# Patient Record
Sex: Male | Born: 1935 | Race: Black or African American | Hispanic: No | Marital: Married | Smoking: Never smoker
Health system: Southern US, Community
[De-identification: ages and names within clinical notes are randomized; demographics above are authoritative.]

## PROBLEM LIST (undated history)

## (undated) DIAGNOSIS — I10 Essential (primary) hypertension: Secondary | ICD-10-CM

---

## 2011-11-21 ENCOUNTER — Ambulatory Visit: Payer: Self-pay | Admitting: Surgery

## 2012-05-05 ENCOUNTER — Observation Stay: Payer: Self-pay | Admitting: Internal Medicine

## 2012-05-05 LAB — URINALYSIS, COMPLETE
Bilirubin,UR: NEGATIVE
Blood: NEGATIVE
Ketone: NEGATIVE
Leukocyte Esterase: NEGATIVE
Nitrite: NEGATIVE
Protein: NEGATIVE
RBC,UR: 1 /HPF (ref 0–5)
Squamous Epithelial: NONE SEEN
WBC UR: <1 /HPF (ref 0–5)

## 2012-05-05 LAB — CBC
HCT: 43.3 % (ref 40.0–52.0)
MCHC: 32.7 g/dL (ref 32.0–36.0)
MCV: 90 fL (ref 80–100)
Platelet: 184 10*3/uL (ref 150–440)
RBC: 4.8 10*6/uL (ref 4.40–5.90)
RDW: 13.6 % (ref 11.5–14.5)

## 2012-05-05 LAB — COMPREHENSIVE METABOLIC PANEL
Anion Gap: 8 (ref 7–16)
BUN: 16 mg/dL (ref 7–18)
Bilirubin,Total: 0.5 mg/dL (ref 0.2–1.0)
Calcium, Total: 8.9 mg/dL (ref 8.5–10.1)
Chloride: 108 mmol/L — ABNORMAL HIGH (ref 98–107)
Creatinine: 1.17 mg/dL (ref 0.60–1.30)
Osmolality: 285 (ref 275–301)
SGPT (ALT): 40 U/L
Total Protein: 7.7 g/dL (ref 6.4–8.2)

## 2012-05-05 LAB — CK TOTAL AND CKMB (NOT AT ARMC): CK-MB: 3.2 ng/mL (ref 0.5–3.6)

## 2012-05-05 LAB — TROPONIN I: Troponin-I: 0.03 ng/mL

## 2012-05-05 LAB — PROTIME-INR: Prothrombin Time: 22.8 secs — ABNORMAL HIGH (ref 11.5–14.7)

## 2012-05-06 LAB — CBC WITH DIFFERENTIAL/PLATELET
Basophil #: 0 10*3/uL (ref 0.0–0.1)
Basophil %: 0.4 %
HCT: 39.4 % — ABNORMAL LOW (ref 40.0–52.0)
MCH: 30 pg (ref 26.0–34.0)
MCHC: 33.6 g/dL (ref 32.0–36.0)
MCV: 89 fL (ref 80–100)
Monocyte #: 0.4 x10 3/mm (ref 0.2–1.0)
Neutrophil #: 2.4 10*3/uL (ref 1.4–6.5)
RDW: 13.2 % (ref 11.5–14.5)
WBC: 4.8 10*3/uL (ref 3.8–10.6)

## 2012-05-06 LAB — BASIC METABOLIC PANEL
Anion Gap: 11 (ref 7–16)
Creatinine: 1.07 mg/dL (ref 0.60–1.30)
EGFR (Non-African Amer.): >60
Osmolality: 292 (ref 275–301)

## 2012-05-06 LAB — LIPID PANEL
Cholesterol: 128 mg/dL (ref 0–200)
HDL Cholesterol: 43 mg/dL (ref 40–60)
Ldl Cholesterol, Calc: 70 mg/dL (ref 0–100)
Triglycerides: 76 mg/dL (ref 0–200)
VLDL Cholesterol, Calc: 15 mg/dL (ref 5–40)

## 2013-01-31 LAB — CBC WITH DIFFERENTIAL/PLATELET
Basophil %: 0.4 %
Eosinophil #: 0.1 10*3/uL (ref 0.0–0.7)
Eosinophil %: 1.2 %
Lymphocyte #: 1.1 10*3/uL (ref 1.0–3.6)
MCH: 30.5 pg (ref 26.0–34.0)
MCV: 91 fL (ref 80–100)
Monocyte #: 0.4 x10 3/mm (ref 0.2–1.0)
Platelet: 203 10*3/uL (ref 150–440)
RBC: 4.62 10*6/uL (ref 4.40–5.90)
RDW: 13.8 % (ref 11.5–14.5)
WBC: 5.1 10*3/uL (ref 3.8–10.6)

## 2013-01-31 LAB — COMPREHENSIVE METABOLIC PANEL
Albumin: 3.4 g/dL (ref 3.4–5.0)
Alkaline Phosphatase: 84 U/L (ref 50–136)
Anion Gap: 9 (ref 7–16)
BUN: 13 mg/dL (ref 7–18)
Chloride: 107 mmol/L (ref 98–107)
Co2: 25 mmol/L (ref 21–32)
EGFR (African American): >60
Glucose: 116 mg/dL — ABNORMAL HIGH (ref 65–99)
Osmolality: 282 (ref 275–301)
SGOT(AST): 41 U/L — ABNORMAL HIGH (ref 15–37)
SGPT (ALT): 27 U/L (ref 12–78)
Sodium: 141 mmol/L (ref 136–145)
Total Protein: 7.4 g/dL (ref 6.4–8.2)

## 2013-01-31 LAB — URINALYSIS, COMPLETE
Glucose,UR: NEGATIVE mg/dL (ref 0–75)
Ketone: NEGATIVE
Leukocyte Esterase: NEGATIVE
Nitrite: NEGATIVE
Protein: 30
Specific Gravity: 1.013 (ref 1.003–1.030)
Squamous Epithelial: NONE SEEN

## 2013-01-31 LAB — TROPONIN I: Troponin-I: <0.02 ng/mL

## 2013-01-31 LAB — PRO B NATRIURETIC PEPTIDE: B-Type Natriuretic Peptide: 106 pg/mL (ref 0–450)

## 2013-01-31 LAB — PROTIME-INR: INR: 2.1

## 2013-02-01 ENCOUNTER — Observation Stay: Payer: Self-pay | Admitting: Family Medicine

## 2013-02-01 LAB — CK-MB: CK-MB: 5.1 ng/mL — ABNORMAL HIGH (ref 0.5–3.6)

## 2013-02-01 LAB — TROPONIN I: Troponin-I: 0.04 ng/mL

## 2013-12-26 ENCOUNTER — Emergency Department: Payer: Self-pay | Admitting: Emergency Medicine

## 2013-12-26 LAB — CBC
HCT: 49.1 % (ref 40.0–52.0)
HGB: 16.1 g/dL (ref 13.0–18.0)
MCH: 29.5 pg (ref 26.0–34.0)
MCHC: 32.8 g/dL (ref 32.0–36.0)
MCV: 90 fL (ref 80–100)
Platelet: 210 10*3/uL (ref 150–440)
RBC: 5.45 10*6/uL (ref 4.40–5.90)
RDW: 13.9 % (ref 11.5–14.5)
WBC: 3.3 10*3/uL — ABNORMAL LOW (ref 3.8–10.6)

## 2013-12-26 LAB — BASIC METABOLIC PANEL
Anion Gap: 3 — ABNORMAL LOW (ref 7–16)
BUN: 9 mg/dL (ref 7–18)
CREATININE: 1.13 mg/dL (ref 0.60–1.30)
Calcium, Total: 9.2 mg/dL (ref 8.5–10.1)
Chloride: 106 mmol/L (ref 98–107)
Co2: 30 mmol/L (ref 21–32)
EGFR (African American): >60
GFR CALC NON AF AMER: 58 — AB
Glucose: 86 mg/dL (ref 65–99)
OSMOLALITY: 276 (ref 275–301)
Potassium: 4.2 mmol/L (ref 3.5–5.1)
Sodium: 139 mmol/L (ref 136–145)

## 2013-12-26 LAB — TROPONIN I: Troponin-I: <0.02 ng/mL

## 2013-12-26 LAB — PROTIME-INR
INR: 2.8
Prothrombin Time: 28.9 secs — ABNORMAL HIGH (ref 11.5–14.7)

## 2013-12-31 LAB — COMPREHENSIVE METABOLIC PANEL
ALK PHOS: 80 U/L
ANION GAP: 4 — AB (ref 7–16)
AST: 27 U/L (ref 15–37)
Albumin: 3.5 g/dL (ref 3.4–5.0)
BILIRUBIN TOTAL: 0.5 mg/dL (ref 0.2–1.0)
BUN: 11 mg/dL (ref 7–18)
CALCIUM: 9 mg/dL (ref 8.5–10.1)
Chloride: 107 mmol/L (ref 98–107)
Co2: 26 mmol/L (ref 21–32)
Creatinine: 1.06 mg/dL (ref 0.60–1.30)
EGFR (African American): >60
Glucose: 111 mg/dL — ABNORMAL HIGH (ref 65–99)
OSMOLALITY: 274 (ref 275–301)
POTASSIUM: 3.6 mmol/L (ref 3.5–5.1)
SGPT (ALT): 17 U/L (ref 12–78)
Sodium: 137 mmol/L (ref 136–145)
TOTAL PROTEIN: 7.4 g/dL (ref 6.4–8.2)

## 2013-12-31 LAB — CBC
HCT: 41.6 % (ref 40.0–52.0)
HGB: 14.2 g/dL (ref 13.0–18.0)
MCH: 30.4 pg (ref 26.0–34.0)
MCHC: 34.2 g/dL (ref 32.0–36.0)
MCV: 89 fL (ref 80–100)
PLATELETS: 175 10*3/uL (ref 150–440)
RBC: 4.68 10*6/uL (ref 4.40–5.90)
RDW: 13.6 % (ref 11.5–14.5)
WBC: 3.8 10*3/uL (ref 3.8–10.6)

## 2013-12-31 LAB — APTT: Activated PTT: 47.5 secs — ABNORMAL HIGH (ref 23.6–35.9)

## 2013-12-31 LAB — PROTIME-INR
INR: 3.4
Prothrombin Time: 33.3 secs — ABNORMAL HIGH (ref 11.5–14.7)

## 2013-12-31 LAB — CK TOTAL AND CKMB (NOT AT ARMC)
CK, TOTAL: 268 U/L — AB (ref 35–232)
CK-MB: 10.6 ng/mL — ABNORMAL HIGH (ref 0.5–3.6)

## 2013-12-31 LAB — TROPONIN I: Troponin-I: <0.02 ng/mL

## 2014-01-01 ENCOUNTER — Observation Stay: Payer: Self-pay | Admitting: Internal Medicine

## 2014-01-01 LAB — BASIC METABOLIC PANEL
Anion Gap: 5 — ABNORMAL LOW (ref 7–16)
BUN: 8 mg/dL (ref 7–18)
CALCIUM: 9.4 mg/dL (ref 8.5–10.1)
CO2: 27 mmol/L (ref 21–32)
CREATININE: 1.1 mg/dL (ref 0.60–1.30)
Chloride: 106 mmol/L (ref 98–107)
EGFR (Non-African Amer.): >60
Glucose: 95 mg/dL (ref 65–99)
Osmolality: 274 (ref 275–301)
POTASSIUM: 3.9 mmol/L (ref 3.5–5.1)
SODIUM: 138 mmol/L (ref 136–145)

## 2014-01-01 LAB — CBC WITH DIFFERENTIAL/PLATELET
BASOS PCT: 0.6 %
Basophil #: 0 10*3/uL (ref 0.0–0.1)
EOS ABS: 0.1 10*3/uL (ref 0.0–0.7)
Eosinophil %: 1.3 %
HCT: 43.4 % (ref 40.0–52.0)
HGB: 14.6 g/dL (ref 13.0–18.0)
Lymphocyte #: 1.4 10*3/uL (ref 1.0–3.6)
Lymphocyte %: 27.8 %
MCH: 30.1 pg (ref 26.0–34.0)
MCHC: 33.6 g/dL (ref 32.0–36.0)
MCV: 90 fL (ref 80–100)
Monocyte #: 0.5 x10 3/mm (ref 0.2–1.0)
Monocyte %: 10.2 %
NEUTROS ABS: 2.9 10*3/uL (ref 1.4–6.5)
Neutrophil %: 60.1 %
Platelet: 185 10*3/uL (ref 150–440)
RBC: 4.83 10*6/uL (ref 4.40–5.90)
RDW: 13.7 % (ref 11.5–14.5)
WBC: 4.9 10*3/uL (ref 3.8–10.6)

## 2014-01-01 LAB — CK TOTAL AND CKMB (NOT AT ARMC)
CK, TOTAL: 507 U/L — AB (ref 35–232)
CK, Total: 527 U/L — ABNORMAL HIGH (ref 35–232)
CK-MB: 18.5 ng/mL — ABNORMAL HIGH (ref 0.5–3.6)
CK-MB: 17.2 ng/mL — AB (ref 0.5–3.6)

## 2014-01-01 LAB — TROPONIN I: Troponin-I: <0.02 ng/mL

## 2014-05-13 ENCOUNTER — Ambulatory Visit: Payer: Self-pay | Admitting: Family Medicine

## 2015-01-16 IMAGING — CR DG CHEST 1V PORT
1 series · 1 of 1 positions shown · non-contrast
Comparison: 12/26/2013

CLINICAL DATA: Chest

EXAM:
PORTABLE CHEST - 1 VIEW

[ap]
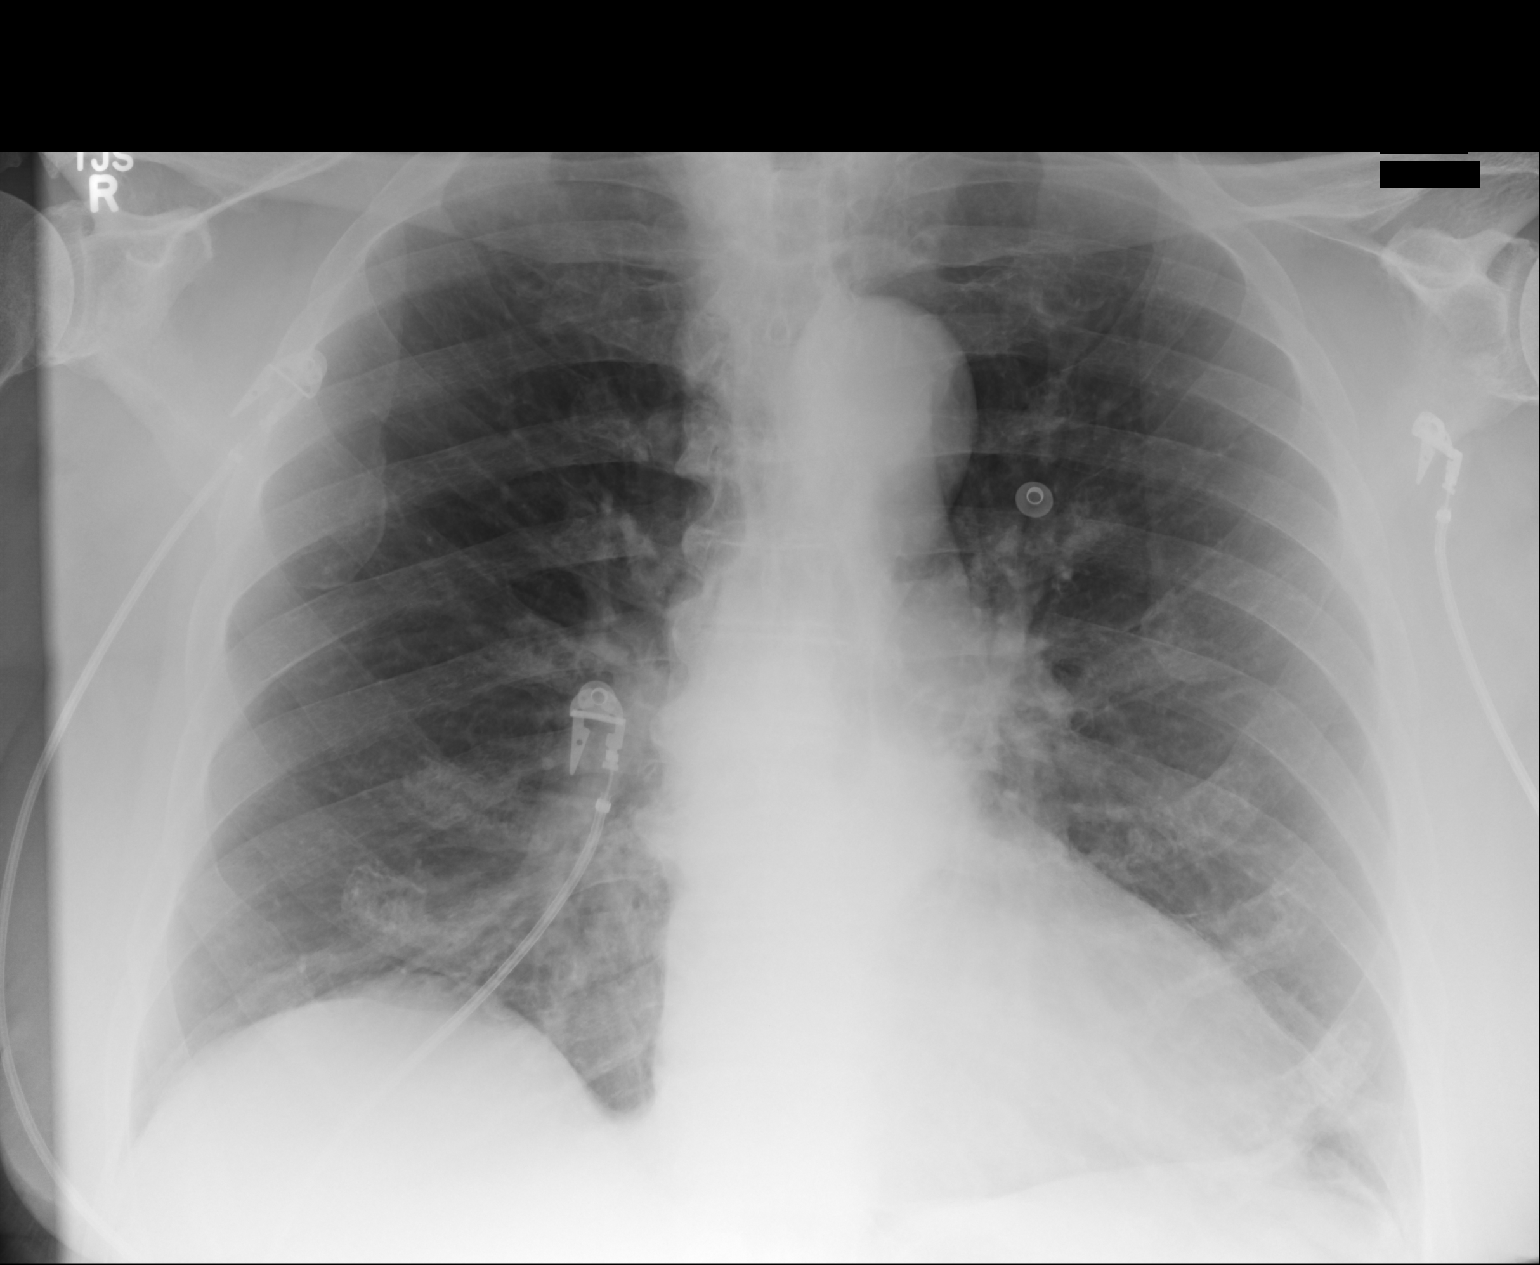

[1 of 1 positions shown; findings below may reference images not displayed]

FINDINGS: Normal heart size and vascularity. No CHF or pneumonia. No effusion
or pneumothorax. Trachea midline. Tortuous atherosclerotic aorta.
Degenerative changes of the spine. No interval change.
IMPRESSION: Stable exam.  No acute process

## 2015-01-16 IMAGING — CT CT HEAD WITHOUT CONTRAST
1 series · 16 of 30 positions shown, 20 images · non-contrast
Comparison: 01/31/2013

CLINICAL DATA: Dizziness, syncope

EXAM:
CT HEAD WITHOUT CONTRAST
TECHNIQUE: Contiguous axial images were obtained from the base of the skull
through the vertex without contrast.

[Series 2: head wo · axial · 0.47mm/px · z∈[-128,+7]mm · 16 of 34 slices shown, 20 images]
[im 2/34  brain]
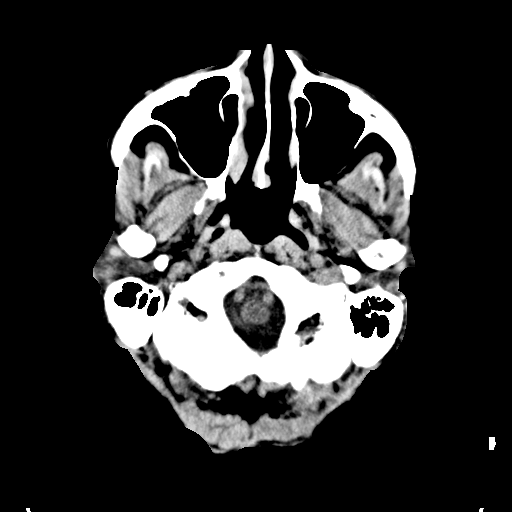
[im 2/34  bone]
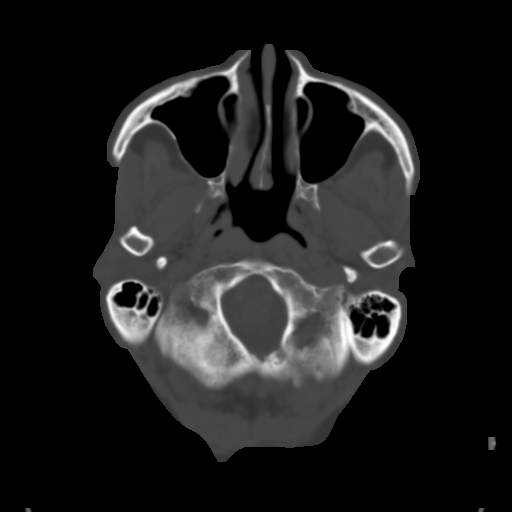
[im 4/34  brain]
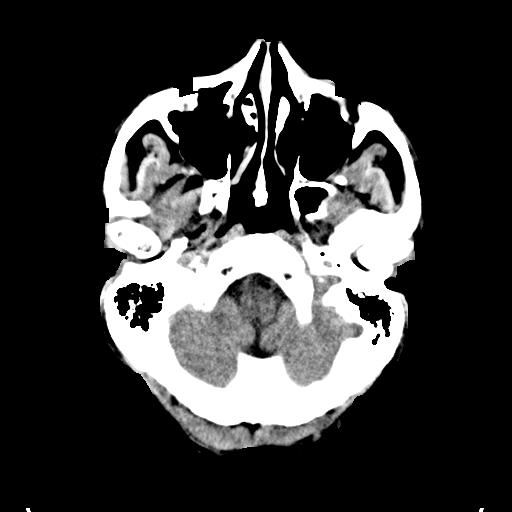
[im 6/34  brain]
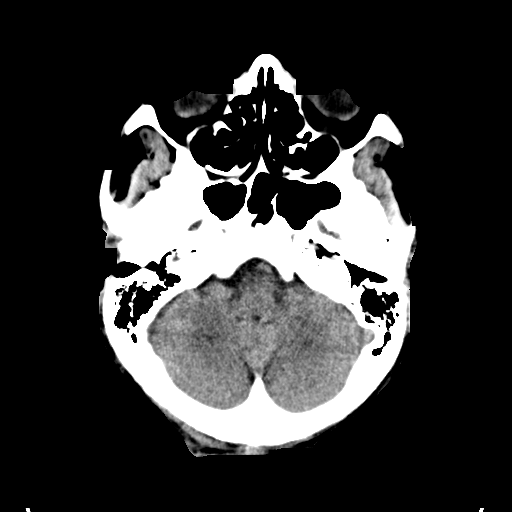
[im 8/34  brain]
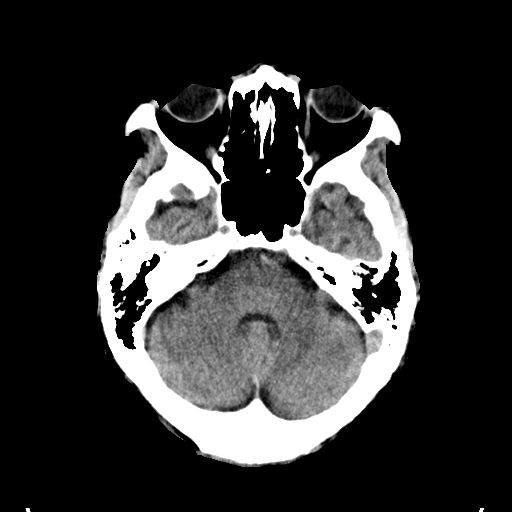
[im 10/34  brain]
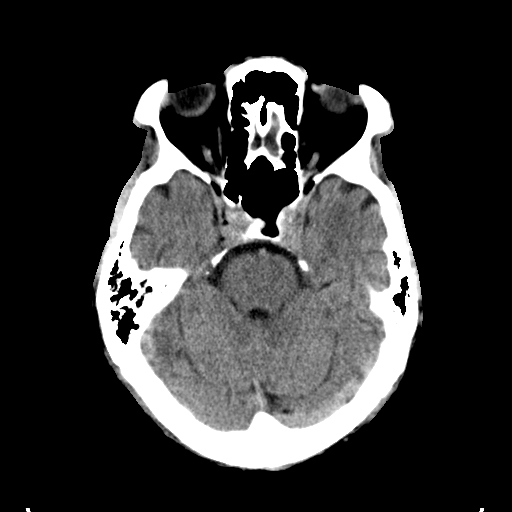
[im 10/34  bone]
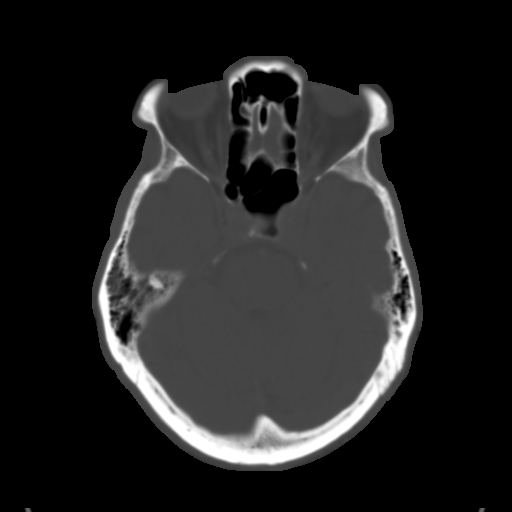
[im 12/34  brain]
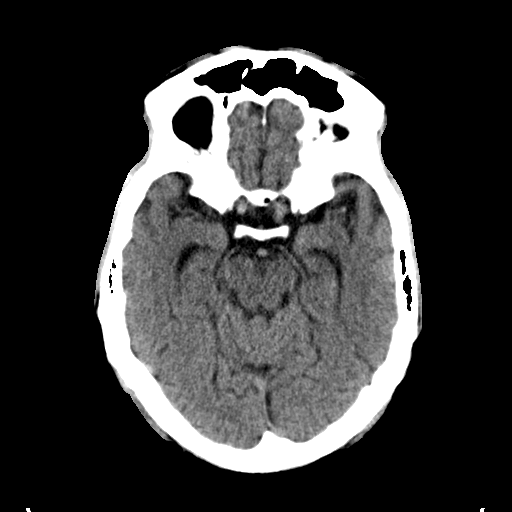
[im 14/34  brain]
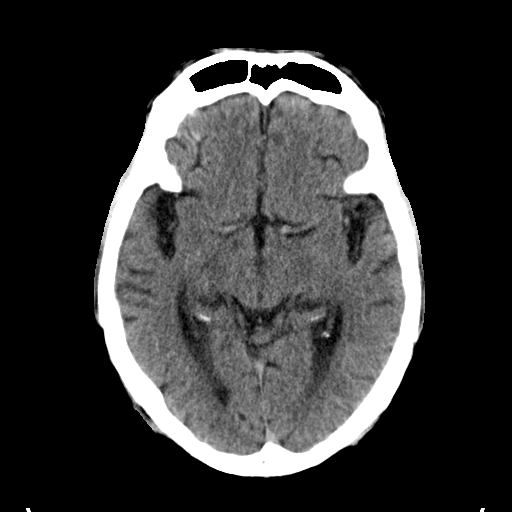
[im 16/34  brain]
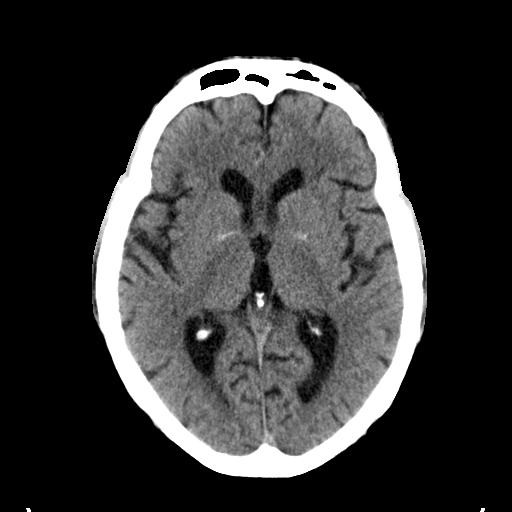
[im 18/34  brain]
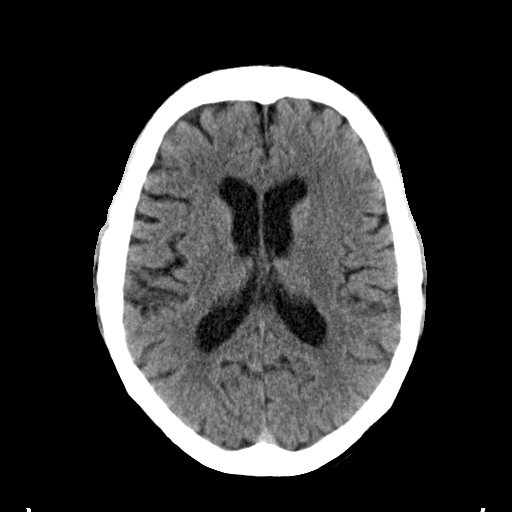
[im 18/34  bone]
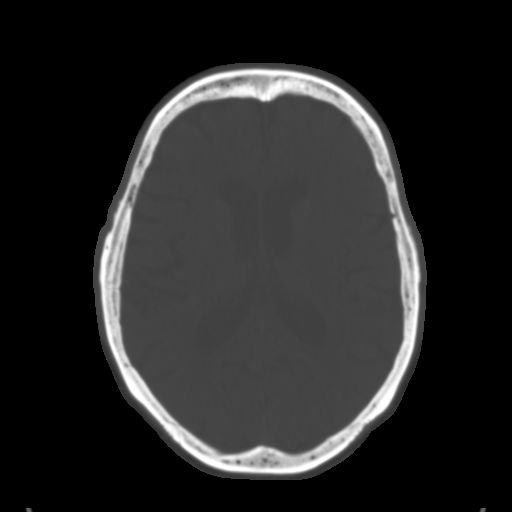
[im 20/34  brain]
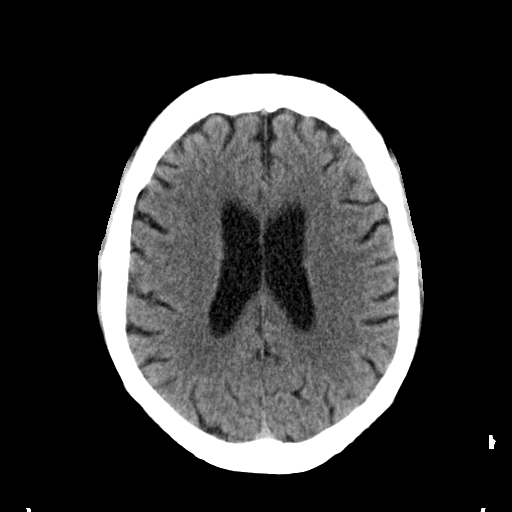
[im 22/34  brain]
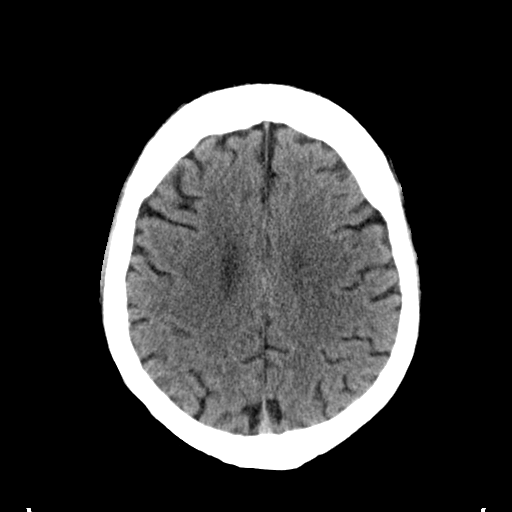
[im 24/34  brain]
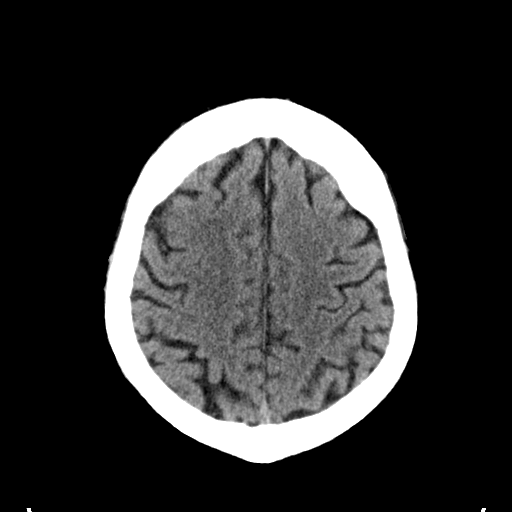
[im 26/34  brain]
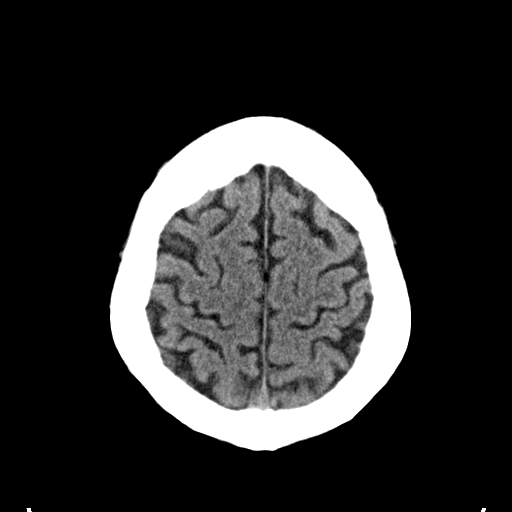
[im 26/34  bone]
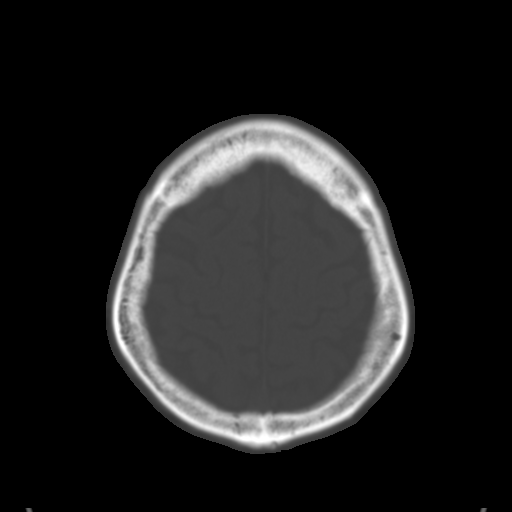
[im 28/34  brain]
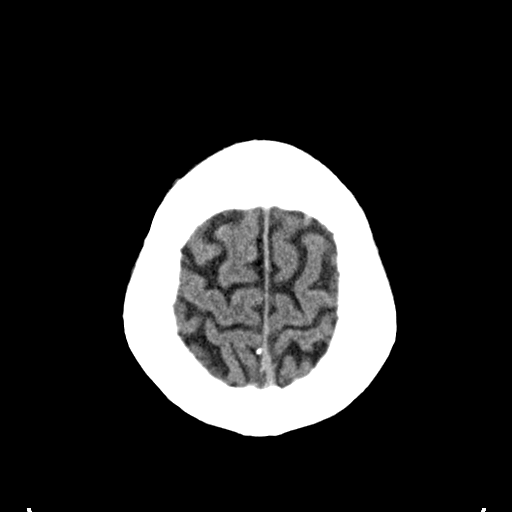
[im 30/34  brain]
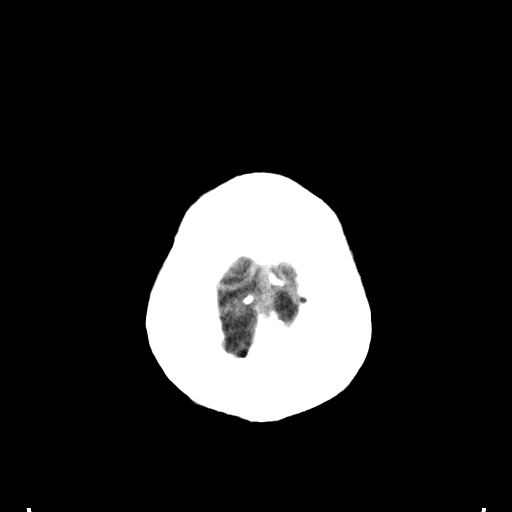
[im 32/34  brain]
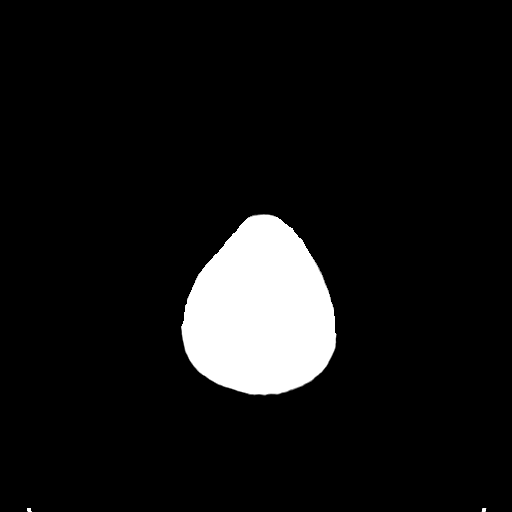

[16 of 30 positions shown; findings below may reference images not displayed]

FINDINGS: Mild age-related brain atrophy and white matter chronic ischemic
change. No acute intracranial hemorrhage, mass lesion, definite
infarction, midline shift, herniation, hydrocephalus, or extra-axial
fluid collection. No focal mass effect or edema. Cisterns patent. No
cerebellar abnormality. Symmetric orbits. Mastoids and sinuses
clear.
IMPRESSION: Stable head CT.  No acute intracranial finding

## 2015-01-17 IMAGING — US US CAROTID DUPLEX BILAT
1 series · 14 of 24 positions shown · non-contrast
Comparison: None.

CLINICAL DATA: Syncope.

EXAM:
BILATERAL CAROTID DUPLEX ULTRASOUND
TECHNIQUE: Gray scale imaging, color Doppler and duplex ultrasound were
performed of bilateral carotid and vertebral arteries in the neck.

[Series 1: us carotid duplex bilat · 0.07mm/px · 14 of 62 slices shown]
[im 1/62]
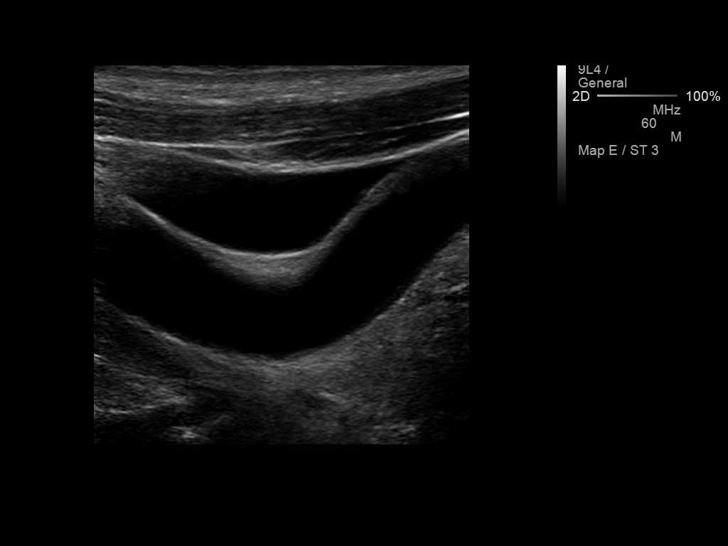
[im 6/62]
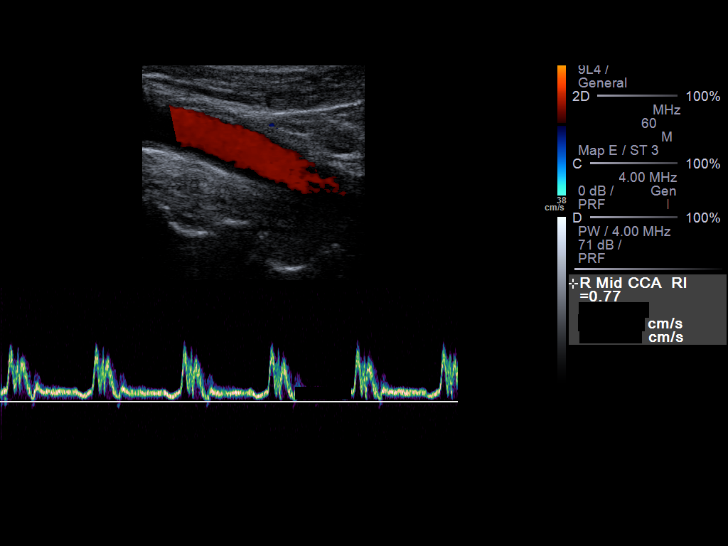
[im 11/62]
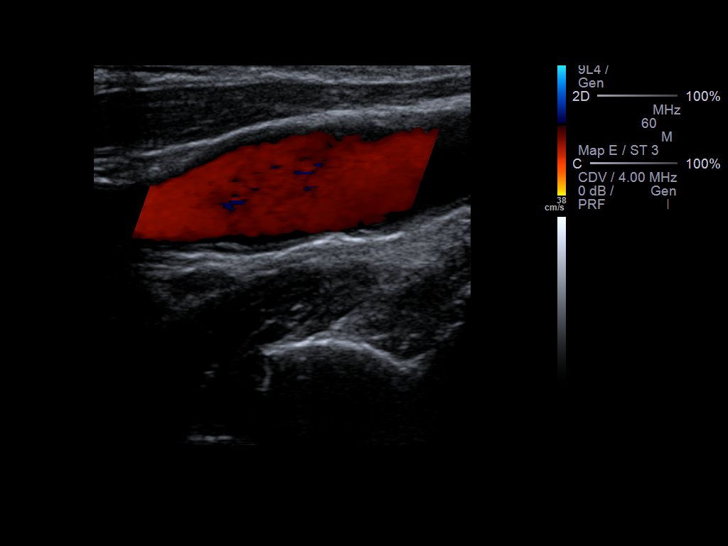
[im 16/62]
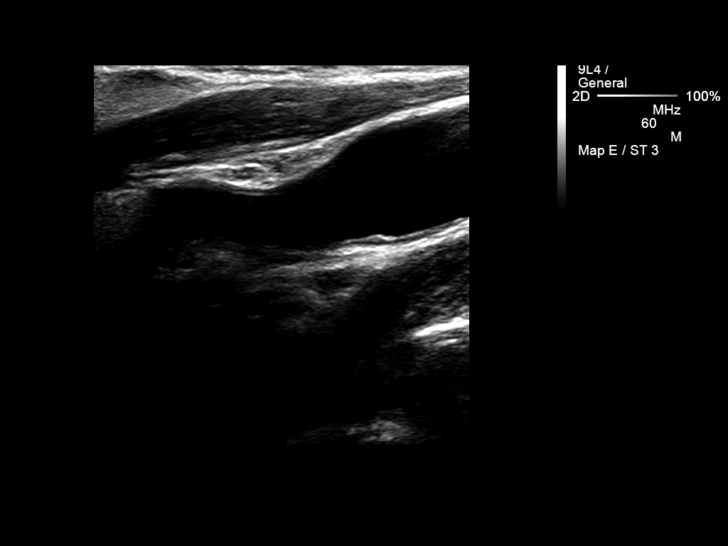
[im 19/62]
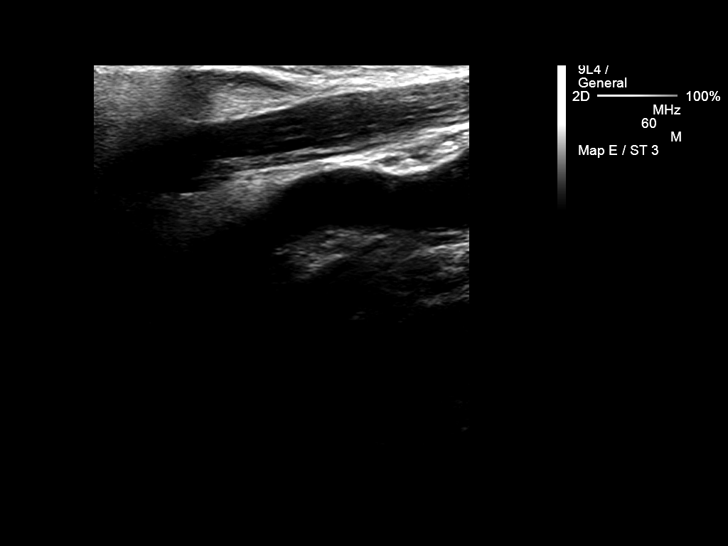
[im 24/62]
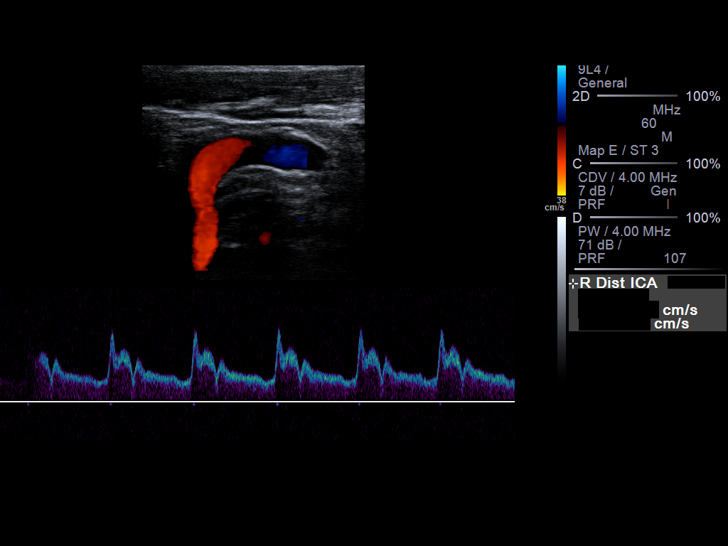
[im 30/62]
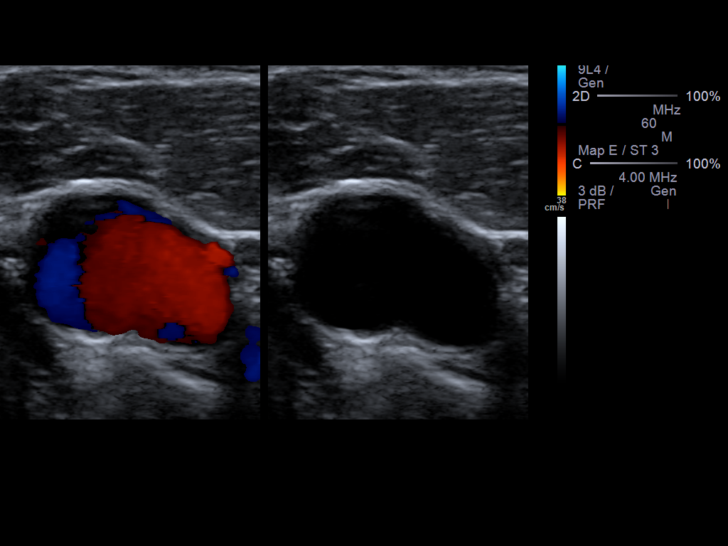
[im 32/62]
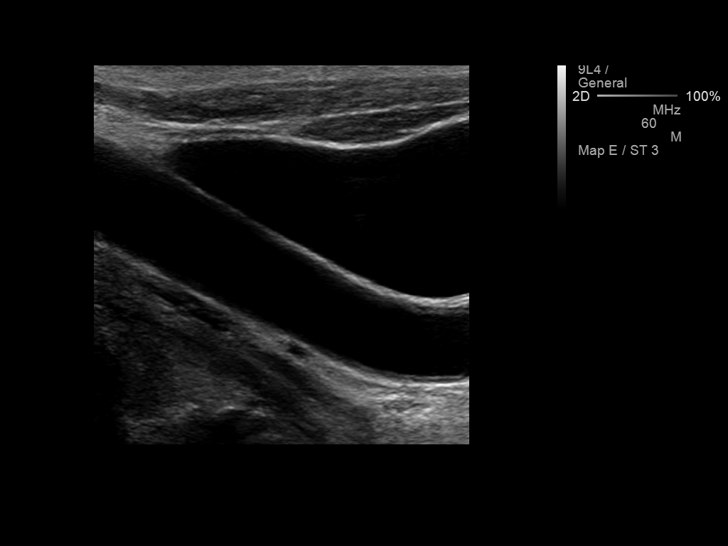
[im 38/62]
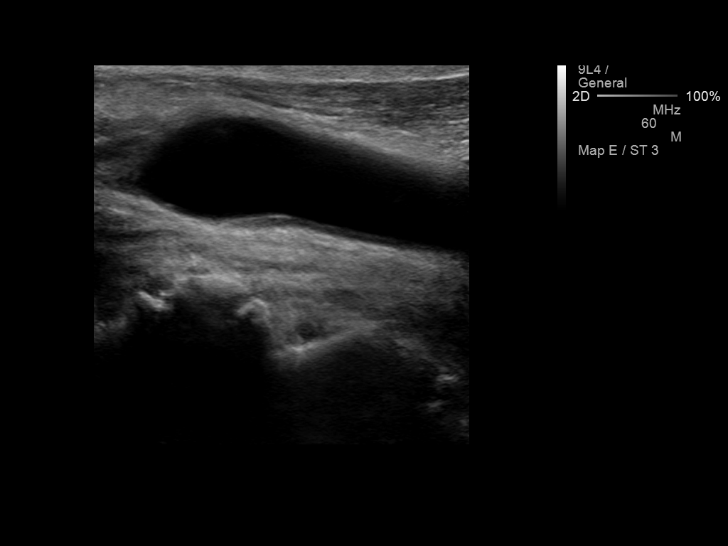
[im 43/62]
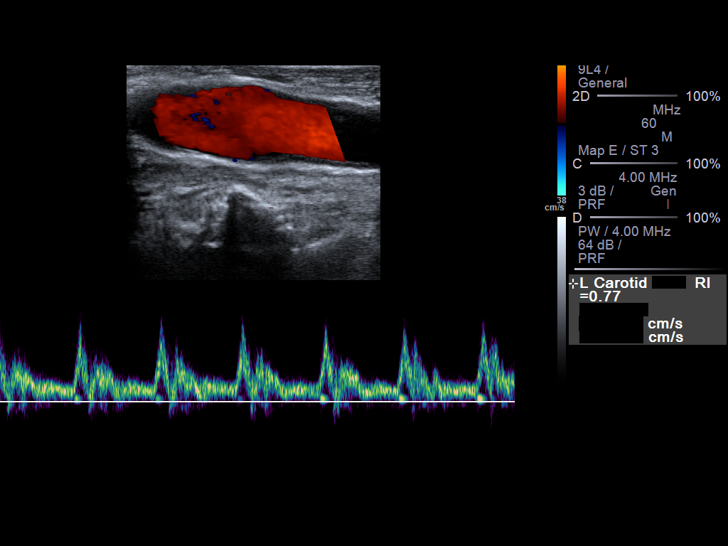
[im 48/62]
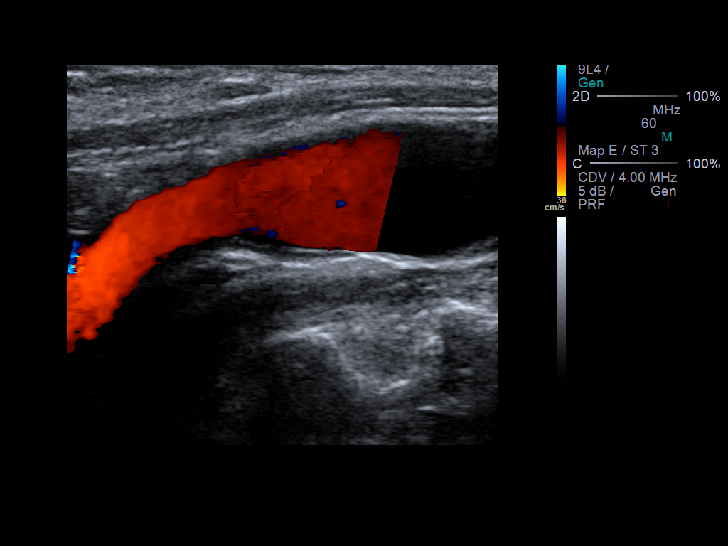
[im 51/62]
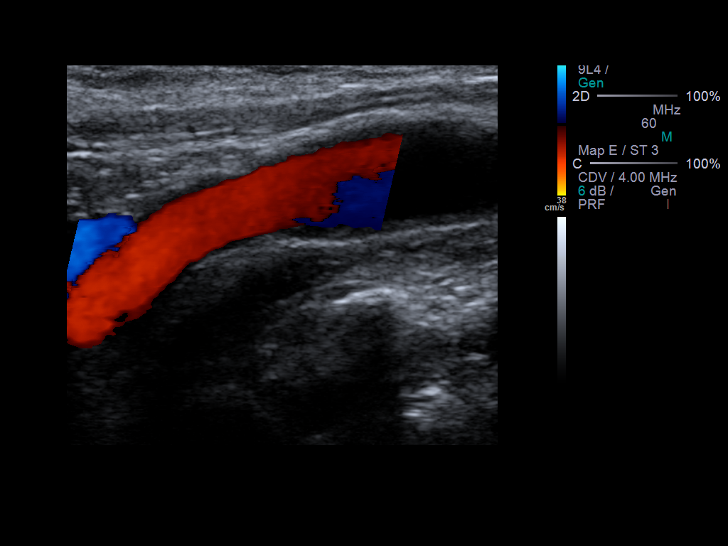
[im 56/62]
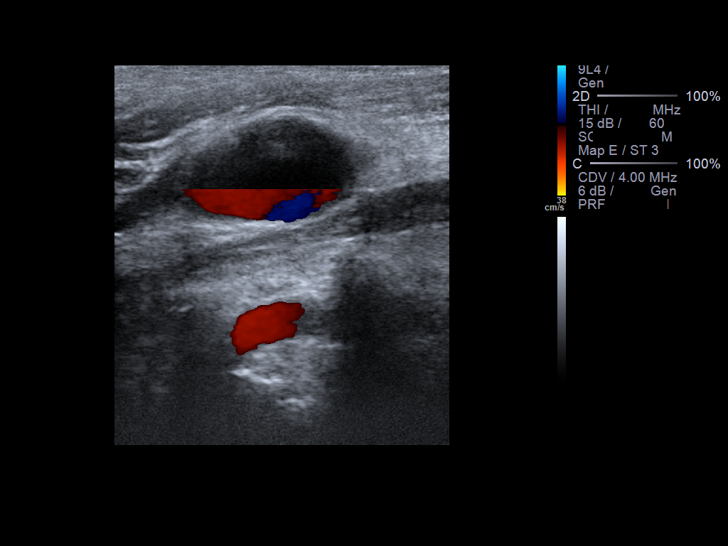
[im 62/62]
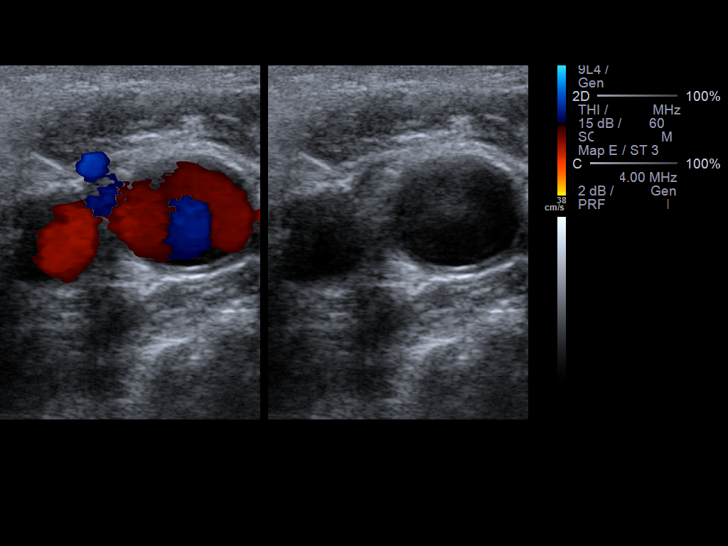

[14 of 24 positions shown; findings below may reference images not displayed]

FINDINGS: Criteria: Quantification of carotid stenosis is based on velocity
parameters that correlate the residual internal carotid diameter
with NASCET-based stenosis levels, using the diameter of the distal
internal carotid lumen as the denominator for stenosis measurement.

The following velocity measurements were obtained:

RIGHT

ICA:  102/13 cm/sec

CCA:  90/21 cm/sec

SYSTOLIC ICA/CCA RATIO:

DIASTOLIC ICA/CCA RATIO:

ECA:  97 cm/sec

LEFT

ICA:  88/26 cm/sec

CCA:  90/26 cm/sec

SYSTOLIC ICA/CCA RATIO:

DIASTOLIC ICA/CCA RATIO:

ECA:  97 cm/sec

RIGHT CAROTID ARTERY: No significant plaque or stenosis is noted.

RIGHT VERTEBRAL ARTERY:  Antegrade flow is noted.

LEFT CAROTID ARTERY:  No significant plaque or stenosis is noted.

LEFT VERTEBRAL ARTERY:  Antegrade flow is noted.
IMPRESSION: No hemodynamically significant plaque or stenosis is noted in either
cervical carotid artery.

## 2015-03-27 NOTE — Discharge Summary (Signed)
PATIENT NAME:  James Clay, James Clay MR#:  [id number] DATE OF BIRTH:  Dec 03, 1924  DATE OF ADMISSION:  01/31/2013 DATE OF DISCHARGE:  02/01/2013  DISCHARGE DIAGNOSES: 1.  Syncope secondary to vasovagal.  2.  Atrial fibrillation, on warfarin.  3.  Hyperlipidemia.   DISCHARGE MEDICATIONS: 1.  Simvastatin 10 mg p.o. at bedtime.  2.  Warfarin 6 mg p.o. daily.  3.  Claritin 10 mg p.o. daily as needed for allergies.   CONSULTS: None.   PROCEDURES: None.   PERTINENT LABS: CT of the head was negative for acute process. C-met and CBC all within normal limits.  BRIEF HOSPITAL COURSE:  1.  Syncope. The patient initially came in with 3 syncopal episodes yesterday, likely due to vasovagal episodes. Denies any chest pain, vision changes or dyspnea at this time. His symptoms have improved. He was able to ambulate on the hallways without any issues and without further symptoms. Plan to discharge to home. May need Holter monitor as an outpatient.  2. Atrial fibrillation. On warfarin. He is in the therapeutic range. Therefore, continue the warfarin. He is also rate controlled. 3.  Hyperlipidemia. Remained stable. Continue on the home medication, Zocor.         DISPOSITION: In stable condition. Will be discharged home. Follow up with Dr. Gilford Rile in 1 to 2 weeks.  ____________________________ Dion Body, MD kl:[id number] D: 02/01/2013 08:14:34 ET T: 02/01/2013 08:38:07 ET JOB#: [id number]  cc: Dion Body, MD, <Dictator> Dion Body MD ELECTRONICALLY SIGNED 03/01/2013 7:57

## 2015-03-27 NOTE — H&P (Signed)
PATIENT NAME:  James Clay, James Clay MR#:  [id number] DATE OF BIRTH:  10/21/1924  DATE OF ADMISSION:  02/01/2013  PRIMARY CARE PHYSICIAN:  Dr. Yates Decamp.   CHIEF COMPLAINT:  Syncope.   HISTORY OF PRESENT ILLNESS:  This is an 81 year old male who presents to the hospital today due to a syncopal episode.  The patient was working on his yard today when he had the syncopal episode.  He does not recall the events.  He just remembers waking up on his back.  His neighbors noticed what happened and therefore called the ambulance to bring him to the hospital.  The patient's daughter and son are also at bedside and also say that he had another syncopal episode shortly after EMS arrived where his eyes rolled backwards and he was somewhat sluggish and unresponsive for a few seconds, but then was back to baseline shortly thereafter.  The patient denied any prodromal symptoms except for some slight blurry vision.  He denied any headache, any chest pain, any palpitations, any nausea, vomiting, fevers, chills, cough, any other associated symptoms.   REVIEW OF SYSTEMS:  CONSTITUTIONAL:  Documented fever.  No weight gain, no weight loss.  EYES:  No blurred or double vision.  EARS, NOSE, THROAT:  No tinnitus.  No postnasal drip.  No redness of the oropharynx.  RESPIRATORY:  No cough, no wheeze, no hemoptysis.  CARDIOVASCULAR:  No chest pain, no orthopnea, no palpitations.  Positive syncope.  GASTROINTESTINAL:  No nausea, no vomiting, no diarrhea.  No abdominal pain, no melena, hematochezia.  GENITOURINARY:  No dysuria or hematuria.  ENDOCRINE:  No polyuria or nocturia.  No heat or cold intolerance.  HEMATOLOGIC:  No anemia, no bruising, no bleeding.  INTEGUMENTARY:  No rashes.  No lesions.  MUSCULOSKELETAL:  No arthritis.  No swelling.  No gout.  NEUROLOGIC:  No numbness or tingling.  No ataxia.  No seizure-type activity.  PSYCHIATRIC:  No anxiety.  No insomnia.  No ADD.   PAST MEDICAL HISTORY:  Was consistent with  chronic atrial fibrillation, hypertension, hyperlipidemia.   ALLERGIES:  No known drug allergies.   SOCIAL HISTORY:  Used to be a smoker, quit in 1972, does have a 30 to 40 pack-year smoking history.  No alcohol abuse.  No illicit drug abuse.  Lives at home with his wife.   FAMILY HISTORY:  No family history of diabetes or coronary artery disease.   CURRENT MEDICATIONS:  Warfarin 6 mg daily, simvastatin 10 mg daily and Claritin 10 mg daily as needed.   PHYSICAL EXAMINATION:  VITAL SIGNS:  Temperature is 98.1, pulse 93, respirations 20, blood pressure 130/55, sat is 97% on room air.  GENERAL:  He is a pleasant-appearing male in no apparent distress.  HEENT:  Atraumatic, normocephalic.  Extraocular muscles are intact.  Pupils equal, round and reactive to light.  Sclerae is anicteric.  No conjunctival injection.  No pharyngeal erythema.  NECK:  Supple.  There is no jugular venous distention, no bruits, no lymphadenopathy or thyromegaly.  HEART:  Irregular.  No murmurs, no rubs, no clicks.  LUNGS:  Clear to auscultation bilaterally.  No rales, no rhonchi, no wheezes.  ABDOMEN:  Soft, flat, nontender, nondistended.  Has good bowel sounds.  No hepatosplenomegaly appreciated.  EXTREMITIES:  No evidence of any cyanosis, clubbing or peripheral edema.  Has +2 pedal and radial pulses bilaterally.  NEUROLOGICAL:  The patient is alert, awake and oriented x 3 with no focal motor or sensory deficits appreciated bilaterally.  SKIN:  Moist, warm with no rashes appreciated.  LYMPHATIC:  There is no cervical or axillary lymphadenopathy.   LABORATORY DATA:  Serum glucose of 116, BUN 13, creatinine 1.06, sodium 141, potassium 4.8, chloride 107, bicarbonate 25.  LFTs are within normal limits.  Troponin less than 0.02.  White cell count 5.1, hemoglobin 14.1, hematocrit 41.8, platelet count of 203.  INR is 2.1.   The patient's urinalysis is within normal limits.    The patient did have a chest x-ray done which  showed no evidence of any acute cardiopulmonary disease.  The patient also had a CT of the head done without contrast showing no evidence of any acute intracranial hemorrhage or any acute ischemic infarction.   ASSESSMENT AND PLAN:  This is an 81 year old male with a history of chronic atrial fibrillation, hypertension, hyperlipidemia, who presents to the hospital due to a syncopal episode.  1.  Syncope.  The exact etiology of syncope is currently unclear.  Questionable if this is vasovagal versus cardiogenic syncope.  The patient does have a history of chronic atrial fibrillation, possibly may have had a tachybrady episode.  I will observe him overnight on telemetry, follow serial cardiac markers, get a two-dimensional echocardiogram.  If his CT head is negative I will go ahead and check orthostatics.  I do not suspect any evidence of neurogenic syncope as his neurological exam is completely nonfocal.  The patient may need an event or loop monitor upon discharge if etiology of syncope is still unclear upon then.  2.  Hyperlipidemia.  Continue simvastatin.   3.  Chronic atrial fibrillation.  The patient is currently rate-controlled.  He is currently not on any rate controlling meds.  Observe him on off-unit telemetry for any bradycardic episodes to explain his syncope.  I will continue his Coumadin.  INR is therapeutic.  4.  Hypertension.  The patient is currently on no meds.  He is presently hemodynamically stable.   The patient will be transferred over to Dr. Tedra CoupeJohn Walker's service.   TIME SPENT ON ADMISSION:  45 minutes.    ____________________________ Rolly PancakeVivek J. Cherlynn KaiserSainani, MD vjs:ea D: 02/01/2013 00:42:42 ET T: 02/01/2013 03:05:17 ET JOB#: [id number]  cc: Rolly PancakeVivek J. Cherlynn KaiserSainani, MD, <Dictator> Houston SirenVIVEK J Seith Aikey MD ELECTRONICALLY SIGNED 02/01/2013 7:52

## 2015-03-28 NOTE — Consult Note (Signed)
General Aspect 81 year old [place] American male admitted with syncope. He does have history of mild aortic stenosis, PEs, with paroxysmal atrial fibrillation, chronically anticoagulated with Coumadin.. Patient's initial EKG showed atrial flutter with   variable rate in the 80s.  However, telemetry has shown sinus rhythm since he's been in the hospital. The patient does live by himself with his wife dying last April.He tears up speaking of her death.  Apparently he states that he got dizzy yesterday as he was eating something for dinner around 7 PM.  He ate an orange and some grapes.  He states that was the most  food he had yesterday.  He tries to drink enough fluids.  He says since his wife died it is hard for him to have regular meals because he doesn't cook.  He  is reporting the room was spinning initially with his dizziness, and then he woke up on the floor.  He was able to get up and called 911.  He denied any chest pain, shortness breath, nausea, vomiting or sweating with the episode.  He denied his heart racing with the episode.  He states last year.  he had 2 episodes of bad dizziness but did not pass out.  He states no problems taking the blood thinner.  His  only  other medication is a statin and an antihistamine. He has had an carotid ultrasound which did not show any stenosis.  Chest x-ray did not show any active disease.  CT of the head did not show any acute findings.  He is pending a surface echocardiogram with his history of mild aortic stenosis.He has not been seen in cardiology by Dr. Ubaldo Glassing for a year and a half.Troponin is negative.   Physical Exam:  GEN no acute distress   HEENT pink conjunctivae   RESP normal resp effort  clear BS   CARD Regular rate and rhythm  Normal sinus rhythm with a few PVCs on telemetry   ABD denies tenderness  soft   EXTR negative edema   SKIN normal to palpation, skin turgor decreased   NEURO cranial nerves intact, motor/sensory function intact    PSYCH alert, A+O to time, place, person   Review of Systems:  Subjective/Chief Complaint Dizziness with syncope   Neurologic: Dizzness  Syncope   Review of Systems: All other systems were reviewed and found to be negative   Medications/Allergies Reviewed Medications/Allergies reviewed   Lab Results: Hepatic:  27-Jan-15 20:37   Bilirubin, Total 0.5  Alkaline Phosphatase 80 (45-117 NOTE: New Reference Range 10/25/13)  SGPT (ALT) 17  SGOT (AST) 27  Total Protein, Serum 7.4  Albumin, Serum 3.5  Cardiology:  27-Jan-15 20:24   Ventricular Rate 88  Atrial Rate 256  QRS Duration 144  QT 356  QTc 430  P Axis 90  R Axis 77  T Axis -26  ECG interpretation Atrial flutter with variable A-V block Right bundle branch block T wave abnormality, consider inferior ischemia Abnormal ECG When compared with ECG of 26-Dec-2013 14:05, Atrial flutter has replaced Sinus rhythm Nonspecific T wave abnormality now evident in Lateral leads ----------unconfirmed---------- Confirmed by OVERREAD, NOT ([id number]), editor PEARSON, BARBARA ([id number]) on 01/01/2014 11:00:30 AM  Routine Chem:  27-Jan-15 20:37   Glucose, Serum  111  BUN 11  Creatinine (comp) 1.06  Sodium, Serum 137  Potassium, Serum 3.6  Chloride, Serum 107  CO2, Serum 26  Calcium (Total), Serum 9.0  Anion Gap  4  Osmolality (calc) 274  eGFR (African American) >  60  eGFR (Non-African American) >60 (eGFR values <66m/min/1.73 m2 may be an indication of chronic kidney disease (CKD). Calculated eGFR is useful in patients with stable renal function. The eGFR calculation will not be reliable in acutely ill patients when serum creatinine is changing rapidly. It is not useful in  patients on dialysis. The eGFR calculation may not be applicable to patients at the low and high extremes of body sizes, pregnant women, and vegetarians.)  Cardiac:  27-Jan-15 20:37   Troponin I < 0.02 (0.00-0.05 0.05 ng/mL or less: NEGATIVE  Repeat testing in 3-6  hrs  if clinically indicated. >0.05 ng/mL: POTENTIAL  MYOCARDIAL INJURY. Repeat  testing in 3-6 hrs if  clinically indicated. NOTE: An increase or decrease  of 30% or more on serial  testing suggests a  clinically important change)  CK, Total  268  CPK-MB, Serum  10.6 (Result(s) reported on 31 Dec 2013 at 09:19PM.)  28-Jan-15 03:12   Troponin I < 0.02 (0.00-0.05 0.05 ng/mL or less: NEGATIVE  Repeat testing in 3-6 hrs  if clinically indicated. >0.05 ng/mL: POTENTIAL  MYOCARDIAL INJURY. Repeat  testing in 3-6 hrs if  clinically indicated. NOTE: An increase or decrease  of 30% or more on serial  testing suggests a  clinically important change)  CK, Total  507  CPK-MB, Serum  18.5 (Result(s) reported on 01 Jan 2014 at 03:40AM.)  Routine Coag:  27-Jan-15 20:37   Prothrombin  33.3  INR 3.4 (INR reference interval applies to patients on anticoagulant therapy. A single INR therapeutic range for coumarins is not optimal for all indications; however, the suggested range for most indications is 2.0 - 3.0. Exceptions to the INR Reference Range may include: Prosthetic heart valves, acute myocardial infarction, prevention of myocardial infarction, and combinations of aspirin and anticoagulant. The need for a higher or lower target INR must be assessed individually. Reference: The Pharmacology and Management of the Vitamin K  antagonists: the seventh ACCP Conference on Antithrombotic and Thrombolytic Therapy. CBTDVV.6160Sept:126 (3suppl): [id number] A HCT value >55% may artifactually increase the PT.  In one study,  the increase was an average of 25%. Reference:  "Effect on Routine and Special Coagulation Testing Values of Citrate Anticoagulant Adjustment in Patients with High HCT Values." American Journal of Clinical Pathology 2006;126:400-405.)  Activated PTT (APTT)  47.5 (A HCT value >55% may artifactually increase the APTT. In one study, the increase was an average of  19%. Reference: "Effect on Routine and Special Coagulation Testing Values of Citrate Anticoagulant Adjustment in Patients with High HCT Values." American Journal of Clinical Pathology 2006;126:400-405.)  Routine Hem:  27-Jan-15 20:37   WBC (CBC) 3.8  RBC (CBC) 4.68  Hemoglobin (CBC) 14.2  Hematocrit (CBC) 41.6  Platelet Count (CBC) 175 (Result(s) reported on 31 Dec 2013 at 09:03PM.)  MCV 89  MCH 30.4  MCHC 34.2  RDW 13.6   Radiology Results: XRay:    27-Jan-15 21:09, Chest Portable Single View  Chest Portable Single View   REASON FOR EXAM:    Chest Pain  COMMENTS:       PROCEDURE: DXR - DXR PORTABLE CHEST SINGLE VIEW  - Dec 31 2013  9:09PM     CLINICAL DATA:  Chest    EXAM:  PORTABLE CHEST - 1 VIEW    COMPARISON:  12/26/2013    FINDINGS:  Normal heart size and vascularity. No CHF or pneumonia. No effusion  or pneumothorax. Trachea midline. Tortuous atherosclerotic aorta.  Degenerative changes of the spine. No interval  change.     IMPRESSION:  Stable exam.  No acute process      Electronically Signed    By: Daryll Brod M.D.    On: 12/31/2013 21:09         Verified By: Earl Gala, M.D.,  [place]:    28-Jan-15 09:05, US Carotid Doppler Bilateral  US Carotid Doppler Bilateral   REASON FOR EXAM:    syncope  COMMENTS:       PROCEDURE: [place]  - US CAROTID DOPPLER BILATERAL  - Jan 01 2014  9:05AM     CLINICAL DATA:  Syncope.    EXAM:  BILATERAL CAROTID DUPLEX ULTRASOUND    TECHNIQUE:  Pearline Cables scale imaging, color Doppler and duplex ultrasound were  performed of bilateral carotid and vertebral arteries in the neck.    COMPARISON:  None.  FINDINGS:  Criteria: Quantification of carotid stenosis is based on velocity  parameters that correlate the residual internal carotid diameter  with NASCET-based stenosis levels, using the diameter of the distal  internal carotid lumen as the denominator for stenosis measurement.    The following velocity measurements were  obtained:    RIGHT    ICA:  102/13 cm/sec    CCA:  09/23 cm/sec    SYSTOLIC ICA/CCA RATIO:  1.1  DIASTOLIC ICA/CCA RATIO:  1.7    ECA:  97 cm/sec    LEFT    ICA:  88/26 cm/sec    CCA:  30/07 cm/sec    SYSTOLIC ICA/CCA RATIO:  1.0    DIASTOLIC ICA/CCA RATIO:  1.0    ECA:  97 cm/sec  RIGHT CAROTID ARTERY: No significant plaque or stenosis is noted.    RIGHT VERTEBRAL ARTERY:  Antegrade flow is noted.    LEFT CAROTID ARTERY:  No significant plaque or stenosis is noted.    LEFT VERTEBRAL ARTERY:  Antegrade flow is noted.     IMPRESSION:  No hemodynamically significant plaque or stenosis is noted in either  cervical carotid artery.      Electronically Signed    By: Sabino Dick M.D.    On: 01/01/2014 09:13         Verified By: Marveen Reeks, M.D.,  Cardiology:    27-Jan-15 20:24, ECG  ECG interpretation   Atrial flutter with variable A-V block  Right bundle branch block  T wave abnormality, consider inferior ischemia  Abnormal ECG  When compared with ECG of 26-Dec-2013 14:05,  Atrial flutter has replaced Sinus rhythm  Nonspecific T wave abnormality now evident in Lateral leads  ----------unconfirmed----------  Confirmed by OVERREAD, NOT ([id number]), editor PEARSON, BARBARA ([id number]) on 01/01/2014 11:00:30 AM  CT:    27-Jan-15 21:43, CT Head Without Contrast  CT Head Without Contrast   REASON FOR EXAM:    syncope on coumadin  COMMENTS:       PROCEDURE: CT  - CT HEAD WITHOUT CONTRAST  - Dec 31 2013  9:43PM     CLINICAL DATA:  Dizziness, syncope    EXAM:  CT HEAD WITHOUT CONTRAST    TECHNIQUE:  Contiguous axial images were obtained from the base of the skull  through the vertex without contrast.    COMPARISON:  01/31/2013  FINDINGS:  Mild age-related brain atrophy and white matter chronic ischemic  change. No acute intracranial hemorrhage, mass lesion, definite  infarction, midline shift, herniation, hydrocephalus, or extra-axial  fluid collection. No focal  mass effect or edema. Cisterns patent. No  cerebellar abnormality. Symmetric orbits. Mastoids and sinuses  clear.     IMPRESSION:  Stable head CT.  No acute intracranial finding      Electronically Signed    By: Daryll Brod M.D.    On: 12/31/2013 21:49     Verified By: Earl Gala, M.D.,    [place]: Other  Vital Signs/Nurse's Notes: **Vital Signs.:   28-Jan-15 11:59  Vital Signs Type Routine  Temperature Temperature (F) 97.6  Celsius 36.4  Temperature Source oral  Pulse Pulse 58  Respirations Respirations 18  Systolic BP Systolic BP 863  Diastolic BP (mmHg) Diastolic BP (mmHg) 69  Mean BP 94  Pulse Ox % Pulse Ox % 97  Pulse Ox Activity Level  At rest  Oxygen Delivery Room Air/ 21 %    Impression 81 year old male with dizziness and syncope with atrial flutter with bearable ray present on EKG on admission but now showing sinus rhythm on telemetry.   Plan 1.  Consideration for issues with breakthrough A. fib flutter that may be contributing to symptoms.  Patient may benefit being placed on low dose beta blocker, possibly 12.5 mg of metoprolol ER if he does not show any significant bradycardia during hospitalization.Continue telemetry. 2.  Possibly social services to get involved with patient for arranging  Meals on Wheels since patient tells me that he eats very little food during the day,  since his wife died last spring. 3.  Echocardiogram is pending with history of mild aortic stenosis. 4.  Do not think a stress test or other cardiac intervention is needed at this time. 5.  Further recommendations per Dr. Nehemiah Massed after the results of the echo are known.   Electronic Signatures: Roderic Palau (NP)  (Signed 28-Jan-15 13:32)  Authored: General Aspect/Present Illness, History and Physical Exam, Review of System, Labs, Radiology, Allergies, Vital Signs/Nurse's Notes, Impression/Plan   Last Updated: 28-Jan-15 13:32 by Roderic Palau (NP)

## 2015-03-28 NOTE — H&P (Signed)
PATIENT NAME:  Randell PatientOCH, [place] MR#:  [id number] DATE OF BIRTH:  February 13, 1924  DATE OF ADMISSION:  01/01/2014  PRIMARY CARE PHYSICIAN:  Dr. Yates DecampJohn Walker.   REFERRING PHYSICIAN:  Dr. Glenetta HewMcLaurin.   CHIEF COMPLAINT:  Syncope.   HISTORY OF PRESENT ILLNESS:  The patient is an 81 year old Caucasian male with past medical history of chronic atrial fibrillation, history of pulmonary embolism, on Coumadin, hypertension and hyperlipidemia, is brought into the ER by his niece and nephew after he passed out.  The patient is reporting that he lives alone and he felt dizzy last night and then passed out for 2 to 3 minutes.  It was an unwitnessed a fall.  He fell on his left side and started hurting on the left side.  He called 9-1-1 and eventually niece and nephew came in.  The patient is brought into the ER.  Daughter is at bedside.  CT head is done which is negative.  The patient denies any chest pain or shortness of breath.  Denies any nausea, vomiting, diarrhea, abdominal pain.  He is denying any complaints during my examination.  Daughter is at bedside.  The patient has reported that he had multiple episodes of syncope in the past and was evaluated.  He could not recall whether he was seen by cardiologist or not.  He denies any palpitations.  Denies any black spots or floaters either.  The patient's blood pressure is normal during my examination.  Hospitalist team is called to admit the patient to explore the etiology of syncope.   PAST MEDICAL HISTORY:  Chronic atrial fibrillation, pulmonary embolism on Coumadin, hypertension, hyperlipidemia, chronic renal insufficiency.   PAST SURGICAL HISTORY:  None.  PSYCHOSOCIAL HISTORY:  Lives alone.  Denies any history of smoking, alcohol or illicit drug usage.   FAMILY HISTORY:  Hypertension runs in his family.   HOME MEDICATIONS:  Coumadin 4 mg 2 tablets by mouth once a day, simvastatin 10 mg once daily, Claritin 10 mg once daily.    REVIEW OF SYSTEMS:  CONSTITUTIONAL:   Denies any fever, fatigue.  EYES:  Denies blurry vision, double vision.  EARS, NOSE, THROAT:  Denies epistaxis or discharge.  RESPIRATION:  Denies cough, COPD.  CARDIOVASCULAR:  Denies chest pain, palpitations.  He felt dizzy prior to his syncopal episode.  GASTROINTESTINAL:  Denies nausea, vomiting, diarrhea, abdominal pain.  GENITOURINARY:  No dysuria or hematuria.  ENDOCRINE:  Denies polyuria, nocturia, thyroid problems.  HEMATOLOGIC AND LYMPHATIC:  No anemia, easy bruising, bleeding.  INTEGUMENTARY:  No acne, rash, lesions.  MUSCULOSKELETAL:  No joint pain in the neck and back.   NEUROLOGIC:  No vertigo, ataxia, dementia.  PSYCHIATRIC:  No ADD, OCD.   PHYSICAL EXAMINATION: VITAL SIGNS:  Temperature 98.6, pulse 73, respirations 18, blood pressure 165/91, pulse ox 96%.  GENERAL APPEARANCE:  Not under acute distress.  Moderately built and nourished. HEENT:  Normocephalic, atraumatic.  Pupils are equally reacting to light and accommodation.  No scleral icterus.  No conjunctival injection.  No sinus tenderness.  Dry mucous membranes.  NECK:  Supple.  No JVD.  No thyromegaly.  Range of motion is intact.  LUNGS:  Clear to auscultation bilaterally.  No accessory muscle usage.  No anterior chest wall tenderness on palpation.  CARDIAC:  S1, S2 normal.  Irregularly irregular. No murmur.  GASTROINTESTINAL:  Soft.  Bowel sounds are positive in all four quadrants.  Nontender, nondistended.  No hepatosplenomegaly.  No masses felt.  NEUROLOGIC:  Awake, alert, oriented x 3.  Cranial nerves II through XII are grossly intact.  Reflexes are 2+.  EXTREMITIES:  No edema.  No cyanosis.  No clubbing.  SKIN:  Warm to touch.  Normal turgor.  No rashes.  No lesions.  MUSCULOSKELETAL:  No joint effusion, tenderness, erythema.  PSYCHIATRIC:  Normal mood and affect.   LABORATORY AND IMAGING STUDIES:  BMP is normal except glucose which is at 111.  LFTs are normal.  Troponins x 2 are less than 0.02.  CK total 268,  CPK-MB 10.6.  CBC normal.  PT 33.3, INR 3.4.  Activated PTT 47.5.  CT head, no acute findings.  Portable chest x-ray, stable exam, no acute process.  A 12-lead EKG:  Atrial flutter with right bundle branch block, nonspecific T wave abnormality.   ASSESSMENT AND PLAN:  An 81 year old Caucasian male brought into the ER by his nephew and niece after he sustained a syncopal episode with past medical history of multiple syncopal episodes with unclear etiology will be admitted with the following assessment and plan.  1.  Syncope, could be from cardiac versus arrhythmias, orthostatic versus vasovagal.  CT head is negative.  We will get cardiac enzymes.  The patient will be monitored on telemetry.  We will obtain echocardiogram.  Cardiology consult is placed to [facility].  Intravenous fluids will be given.  We will check orthostatics.  The patient will be on intravenous fluids.  2.  Chronic atrial fibrillation and history of pulmonary embolism, on Coumadin.  INR is supratherapeutic at 3.4.  We will hold Coumadin and check PT/INR.  3.  Hypertension.  Blood pressure is stable.  4.  Hyperlipidemia.  Continue statin.  5.  We will provide him gastrointestinal prophylaxis.  6.  Deep vein thrombosis prophylaxis is not needed as the patient's INR is at 3.4.   7.  He is DO NOT RESUSCITATE.  Daughter is the medical power of attorney.   Diagnosis and plan of care was discussed in detail with the patient and his daughter at bedside.  They both verbalized understanding of the plan.  We will transfer the patient to Dr. Dan Humphreys in the a.m.   Time spent is 50 minutes.    ____________________________ Ramonita Lab, MD ag:ea D: 01/01/2014 01:29:32 ET T: 01/01/2014 02:09:39 ET JOB#: [id number]  cc: Ramonita Lab, MD, <Dictator> Ramonita Lab MD ELECTRONICALLY SIGNED 01/12/2014 7:32

## 2015-03-29 NOTE — Discharge Summary (Signed)
PATIENT NAME:  James Clay, James Clay MR#:  [id number] DATE OF BIRTH:  [birth year]  DATE OF ADMISSION:  05/05/2012 DATE OF DISCHARGE:  05/06/2012  DISCHARGE DIAGNOSES:  1. Vasovagal syncope.  2. Chronic atrial fibrillation.  3. Mild aortic stenosis.  4. Sleep apnea.   DISCHARGE MEDICATIONS:  1. Simvastatin 10 mg at bedtime.  2. Warfarin 6 mg at bedtime.   REASON FOR ADMISSION: 81 year old male presents with syncope post moving his bowels. Please see history and physical for history of present illness, past medical history, physical exam.   HOSPITAL COURSE: Patient was admitted, ruled out for myocardial infarction. Blood pressure systolic running in the 110 to 115 range, still a little dizzy when he stood up. Work-up negative with normal brain CT, chest x-ray and carotid Doppler's. The amlodipine was held. His symptoms improved. He was treated very minimally for allergic-type symptoms with Flonase and meclizine. He will be going home off of amlodipine. Follow up with Dr. Dan HumphreysWalker with his blood pressure. He does have mildly AS, there is no sign for progression of that or instability nor sign for acute coronary syndrome. Overall prognosis is good.  ____________________________ Danella PentonMark F. Allesandra Huebsch, MD mfm:cms D: 05/06/2012 91:47:8209:08:24 ET T: 05/06/2012 11:48:02 ET JOB#: [id number]  cc: Danella PentonMark F. Enrrique Mierzwa, MD, <Dictator> James Forrey Sherlene ShamsF Emiah Pellicano MD ELECTRONICALLY SIGNED 05/08/2012 8:05

## 2015-03-29 NOTE — Op Note (Signed)
PATIENT NAME:  James Clay, Marrell MR#:  [id number] DATE OF BIRTH:  11/21/1924  DATE OF PROCEDURE:  11/21/2011  PREOPERATIVE DIAGNOSIS: Left inguinal hernia.   POSTOPERATIVE DIAGNOSIS: Left inguinal hernia.   PROCEDURE: Left inguinal hernia repair.   SURGEON: Renda RollsWilton Smith, MD  ASSISTANT: Carmell AustriaAnn Collins, PA  ANESTHESIA: General.   INDICATIONS: This 81 year old male came in with the chief complaint of pain and bulging in the left lower abdomen. A large left inguinal hernia was demonstrated on physical examination, which was chronically incarcerated. Repair was recommended for definitive treatment.   DESCRIPTION OF PROCEDURE: The patient was placed on the operating table in the supine position under general anesthesia. The abdomen was prepared with ChloraPrep and draped in a sterile manner.   A left lower quadrant transversely oriented suprapubic incision was made that was approximately 5 inches in length and carried down through subcutaneous tissues. Numerous small bleeding points were cauterized. Scarpa's fascia was incised. The external oblique aponeurosis was incised along the course of its fibers to open the external ring and expose the inguinal cord structures. There was a large mass within the hernia sac. At this time the hernia subsequently reduced. Next, cremaster fibers were spread to expose a hernia sac which was dissected free from the surrounding structures. The sac was approximately 7 inches in length. It also had sigmoid colon attached to it. Dissection was carried out between the sigmoid colon and the sac to reduce the sigmoid colon back into the abdomen. Continued dissection of the sac was carried out. A 0 Surgilon pursestring suture was placed in the sac, it was doubly ligated, the sac was excised, and the stump allowed to retract. Next, a cord lipoma which was some 7 inches in length was dissected free from surrounding structures and followed up into the internal ring which was also ligated  with 0 Surgilon suture ligature, amputated the stump and allowed it to retract. Next, the repair was carried out with a row of 0 Surgilon sutures beginning at the pubic tubercle and carrying this up to the internal ring suturing the conjoined tendon to the Cooper's ligament and to the shelving edge of the inguinal ligament incorporating transversalis fascia into the repair. The last stitch led to satisfactory narrowing of the internal ring. An Atrium onlay mesh was used. This was cut out to an oval shape of some 3 x 5 cm in dimension. A notch was cut out for the cord structures and this was placed onto the floor of the inguinal canal. It was sutured to the previously placed suture line with 0 Surgilon and also was sutured medially to the deep fascia and bilaterally around the internal ring. The repair looked good. Hemostasis was intact. The cord structures were replaced along the floor of the inguinal canal along with the ilioinguinal nerve. The cut edges of the external oblique aponeurosis were closed with running 4-0 Vicryl to recreate the external ring. Next, the deep fascia superior and lateral to the repair site was infiltrated with 0.5% Sensorcaine with epinephrine and also subcutaneous tissues just beneath the skin were infiltrated as well. Scarpa's fascia was closed with interrupted 4-0 Vicryl. The skin was closed with running 4-0 Monocryl subcuticular suture and Dermabond.   The patient tolerated the procedure satisfactorily and is now being prepared for transfer to the recovery room. ____________________________ Shela CommonsJ. Renda RollsWilton Smith, MD jws:slb D: 11/21/2011 10:50:12 ET T: 11/21/2011 12:24:02 ET JOB#: [id number]  cc: Adella HareJ. Dmoni Smith, MD, <Dictator> Adella HareWILTON J SMITH MD ELECTRONICALLY SIGNED 12/10/2011 17:36

## 2015-03-29 NOTE — H&P (Signed)
PATIENT NAME:  James Clay, James Clay MR#:  [id number] DATE OF BIRTH:  [birth year]  DATE OF ADMISSION:  05/05/2012  PRIMARY CARE PHYSICIAN: Dr. Yates Decamp   CHIEF COMPLAINT: "I passed out."  HISTORY OF PRESENT ILLNESS: This is an 81 year old man who stated that he had a bowel movement. He was cleaning himself up and he felt funny. He got dizzy and dropped to the floor. When he regained consciousness he was lying on the floor between the bathtub and the commode. He got up, he still felt a little bit woozy but he got to the phone and called his family who brought him in for further evaluation. In the Emergency Room he was found to have an INR that was therapeutic at 2.0. He is on Coumadin for atrial fibrillation. Blood pressure 122/62 lying, sitting 133/69 and standing 141/75. Patient is not orthostatic. Hospitalist services were contacted for further evaluation.   PAST MEDICAL HISTORY:  1. Hyperlipidemia.  2. Hypertension.  3. Atrial fibrillation on Coumadin. 4. Aortic stenosis which is mild. 5. Sleep apnea.   PAST SURGICAL HISTORY:  1. Bilateral hernia repair. 2. Hemorrhoids. 3. Cyst on the head.   ALLERGIES: Flomax as per [facility] record.   MEDICATIONS:  1. Amlodipine 10 mg daily.  2. Simvastatin 10 mg at night.  3. Warfarin total of 6 mg at bedtime.   SOCIAL HISTORY: No smoking. Occasional wine. No drug use. Worked at [employer] in the past.    FAMILY HISTORY: Father died in his 82s unknown cause. Mother died at 56 after childbirth complications.   REVIEW OF SYSTEMS: CONSTITUTIONAL: No fever, chills, or sweats. Positive for weight loss, 10 to 11 pounds over the past three months. Positive for fatigue. EYES: He does need glasses and decreased vision. EARS, NOSE, MOUTH, AND THROAT: Decreased hearing. Positive for postnasal drip. No sore throat. No difficulty swallowing. CARDIOVASCULAR: No chest pain. Occasional palpitations. RESPIRATORY: Occasional shortness of breath. No coughing. No  sputum. No hemoptysis. GASTROINTESTINAL: No nausea. No vomiting. No abdominal pain. No diarrhea. No constipation. No bright red blood per rectum. No melena. GENITOURINARY: Occasional burning on urination. No hematuria. MUSCULOSKELETAL: No joint pain or muscle pain. INTEGUMENT: No rashes or eruptions. NEUROLOGIC: Positive for passing out in the past and feeling woozy. PSYCHIATRIC: No anxiety, depression. ENDOCRINE: No thyroid problems. HEMATOLOGIC/LYMPHATIC: No anemia, no easy bruising.   PHYSICAL EXAMINATION:  VITAL SIGNS: Temperature 98.2, pulse 84, respirations 18, blood pressure 160/74, pulse oximetry 93%. Orthostatics blood pressure 122/62 lying, sitting 133/69, standing 141/75.   GENERAL: No respiratory distress.   EYES: Conjunctivae and lids normal. Pupils equal, round, and reactive to light. Extraocular muscles intact. No nystagmus.   EARS, NOSE, MOUTH, AND THROAT: Tympanic membranes no erythema. Nasal mucosa no erythema. Throat no erythema. No exudate seen. Lips and gums no lesions.   NECK: No JVD. No bruits. No lymphadenopathy. No thyromegaly. No thyroid nodules palpated.   RESPIRATORY: Lungs clear to auscultation. No use of accessory muscles to breathe. No rhonchi, rales, or wheeze heard.   CARDIOVASCULAR: S1, S2 normal. +2/6 systolic ejection murmur. Carotid upstroke 2+ bilaterally. No bruits.   EXTREMITIES: Dorsalis pedis pulses 2+ bilaterally. No edema of the lower extremity.   ABDOMEN: Soft, nontender. No organomegaly/splenomegaly. Normoactive bowel sounds. No masses felt.   LYMPHATIC: No lymph nodes in the neck.   MUSCULOSKELETAL: No clubbing, edema, or cyanosis.   SKIN: No rashes or ulcers seen.   NEUROLOGIC: Cranial nerves II through XII grossly intact. Deep tendon reflexes 2+ bilateral lower  extremities. Power 5/5 upper and lower extremities. Sensation intact. Babinski negative.  PSYCHIATRIC: Patient is oriented to person, place, and time.    LABORATORY, DIAGNOSTIC  AND RADIOLOGICAL DATA: Urinalysis negative. Chest x-ray: No acute abnormality. Glucose 111, BUN 16, creatinine 1.17, sodium 142, potassium 4.0, chloride 108, CO2 26, calcium 8.9. Liver function tests: AST slightly elevated at 41. White blood cell count 3.8, hemoglobin and hematocrit 14.2 and 43.3, platelet count 184. INR 2.0. EKG normal sinus rhythm, right bundle branch block, flipped T waves inferiorly.   ASSESSMENT AND PLAN:  1. Syncope. Since the patient is on Coumadin and did lose consciousness and ended up on the floor in the bathroom I will get a CT scan of the head STAT to rule out bleed. If the CT scan of the head is negative for bleed will admit as an observation. Will put on telemetry monitoring. Get an echocardiogram and carotid ultrasound and get serial cardiac enzymes. I will give IV fluid hydration with a slightly elevated creatinine, could be dehydration. Could also be a vasovagal syncope with having a syncopal episode after a bowel movement. Will continue to check orthostatics and give IV fluid hydration.  2. Hypertension. Continue Norvasc.  3. Atrial fibrillation, rate controlled. Will monitor on telemetry. Patient is therapeutic on his Coumadin. Will continue if there is no bleed on the CAT scan of the head.  4. Hyperlipidemia. Continue Zocor.  5. Sleep apnea.  TIME SPENT ON ADMISSION: 55 minutes.   CODE STATUS: Patient is a FULL CODE.   NOTE: Again, waiting for CT scan of the head results prior to hospital observation.   ____________________________ Herschell Dimesichard J. Renae GlossWieting, MD rjw:cms D: 05/05/2012 11:55:23 ET T: 05/05/2012 12:22:01 ET JOB#: [id number]  cc: Herschell Dimesichard J. Renae GlossWieting, MD, <Dictator> John B. Danne HarborWalker III, MD Salley ScarletICHARD J Christina Waldrop MD ELECTRONICALLY SIGNED 05/06/2012 14:03

## 2015-11-09 ENCOUNTER — Emergency Department: Payer: Commercial Managed Care - HMO

## 2015-11-09 ENCOUNTER — Emergency Department
Admission: EM | Admit: 2015-11-09 | Discharge: 2015-11-09 | Disposition: A | Discharge status: Other Acute Inpatient Hospital | Payer: Commercial Managed Care - HMO | Attending: Emergency Medicine | Admitting: Emergency Medicine

## 2015-11-09 ENCOUNTER — Encounter: Payer: Self-pay | Admitting: Emergency Medicine

## 2015-11-09 DIAGNOSIS — I10 Essential (primary) hypertension: Secondary | ICD-10-CM | POA: Insufficient documentation

## 2015-11-09 DIAGNOSIS — Y9289 Other specified places as the place of occurrence of the external cause: Secondary | ICD-10-CM | POA: Insufficient documentation

## 2015-11-09 DIAGNOSIS — S134XXA Sprain of ligaments of cervical spine, initial encounter: Secondary | ICD-10-CM | POA: Insufficient documentation

## 2015-11-09 DIAGNOSIS — I714 Abdominal aortic aneurysm, without rupture, unspecified: Secondary | ICD-10-CM

## 2015-11-09 DIAGNOSIS — W1839XA Other fall on same level, initial encounter: Secondary | ICD-10-CM | POA: Insufficient documentation

## 2015-11-09 DIAGNOSIS — G839 Paralytic syndrome, unspecified: Secondary | ICD-10-CM | POA: Diagnosis not present

## 2015-11-09 DIAGNOSIS — S199XXA Unspecified injury of neck, initial encounter: Secondary | ICD-10-CM | POA: Diagnosis present

## 2015-11-09 DIAGNOSIS — R200 Anesthesia of skin: Secondary | ICD-10-CM | POA: Insufficient documentation

## 2015-11-09 DIAGNOSIS — I441 Atrioventricular block, second degree: Secondary | ICD-10-CM | POA: Insufficient documentation

## 2015-11-09 DIAGNOSIS — Y9389 Activity, other specified: Secondary | ICD-10-CM | POA: Insufficient documentation

## 2015-11-09 DIAGNOSIS — Y998 Other external cause status: Secondary | ICD-10-CM | POA: Insufficient documentation

## 2015-11-09 DIAGNOSIS — S299XXA Unspecified injury of thorax, initial encounter: Secondary | ICD-10-CM | POA: Insufficient documentation

## 2015-11-09 DIAGNOSIS — R32 Unspecified urinary incontinence: Secondary | ICD-10-CM | POA: Diagnosis not present

## 2015-11-09 HISTORY — DX: Essential (primary) hypertension: I10

## 2015-11-09 LAB — URINALYSIS COMPLETE WITH MICROSCOPIC (ARMC ONLY)
Bilirubin Urine: NEGATIVE
GLUCOSE, UA: NEGATIVE mg/dL
Hgb urine dipstick: NEGATIVE
KETONES UR: NEGATIVE mg/dL
LEUKOCYTES UA: NEGATIVE
NITRITE: NEGATIVE
Protein, ur: 30 mg/dL — AB
SPECIFIC GRAVITY, URINE: 1.015 (ref 1.005–1.030)
pH: 6 (ref 5.0–8.0)

## 2015-11-09 LAB — COMPREHENSIVE METABOLIC PANEL
ALBUMIN: 3.3 g/dL — AB (ref 3.5–5.0)
ALT: 12 U/L — ABNORMAL LOW (ref 17–63)
ANION GAP: 4 — AB (ref 5–15)
AST: 19 U/L (ref 15–41)
Alkaline Phosphatase: 60 U/L (ref 38–126)
BILIRUBIN TOTAL: 0.4 mg/dL (ref 0.3–1.2)
BUN: 17 mg/dL (ref 6–20)
CO2: 23 mmol/L (ref 22–32)
Calcium: 8.8 mg/dL — ABNORMAL LOW (ref 8.9–10.3)
Chloride: 114 mmol/L — ABNORMAL HIGH (ref 101–111)
Creatinine, Ser: 1.27 mg/dL — ABNORMAL HIGH (ref 0.61–1.24)
GFR calc Af Amer: 56 mL/min — ABNORMAL LOW (ref >60)
GFR calc non Af Amer: 48 mL/min — ABNORMAL LOW (ref >60)
GLUCOSE: 89 mg/dL (ref 65–99)
POTASSIUM: 4.1 mmol/L (ref 3.5–5.1)
Sodium: 141 mmol/L (ref 135–145)
TOTAL PROTEIN: 6.2 g/dL — AB (ref 6.5–8.1)

## 2015-11-09 LAB — CBC WITH DIFFERENTIAL/PLATELET
BASOS ABS: 0 10*3/uL (ref 0–0.1)
BASOS PCT: 1 %
Eosinophils Absolute: 0.1 10*3/uL (ref 0–0.7)
Eosinophils Relative: 3 %
HEMATOCRIT: 39.1 % — AB (ref 40.0–52.0)
HEMOGLOBIN: 13.2 g/dL (ref 13.0–18.0)
LYMPHS PCT: 38 %
Lymphs Abs: 1 10*3/uL (ref 1.0–3.6)
MCH: 30.1 pg (ref 26.0–34.0)
MCHC: 33.8 g/dL (ref 32.0–36.0)
MCV: 89.1 fL (ref 80.0–100.0)
Monocytes Absolute: 0.3 10*3/uL (ref 0.2–1.0)
Monocytes Relative: 11 %
NEUTROS ABS: 1.3 10*3/uL — AB (ref 1.4–6.5)
NEUTROS PCT: 47 %
Platelets: 155 10*3/uL (ref 150–440)
RBC: 4.39 MIL/uL — AB (ref 4.40–5.90)
RDW: 13.7 % (ref 11.5–14.5)
WBC: 2.6 10*3/uL — AB (ref 3.8–10.6)

## 2015-11-09 LAB — PROTIME-INR
INR: 1.3
PROTHROMBIN TIME: 16.3 s — AB (ref 11.4–15.0)

## 2015-11-09 LAB — TYPE AND SCREEN
ABO/RH(D): O POS
Antibody Screen: NEGATIVE

## 2015-11-09 LAB — ABO/RH: ABO/RH(D): O POS

## 2015-11-09 LAB — TROPONIN I

## 2015-11-09 MED ORDER — IOHEXOL 350 MG/ML SOLN
100.0000 mL | Freq: Once | INTRAVENOUS | Status: AC | PRN
  Administered 2015-11-09: 100 mL via INTRAVENOUS

## 2015-11-09 MED ORDER — ATROPINE SULFATE 0.1 MG/ML IJ SOLN: 1.0000 mg | Freq: Once | INTRAMUSCULAR | Status: AC

## 2015-11-09 MED ORDER — ATROPINE SULFATE 0.1 MG/ML IJ SOLN
1.0000 mg | Freq: Once | INTRAMUSCULAR | Status: AC
  Administered 2015-11-09: 1 mg via INTRAVENOUS

## 2015-11-09 MED ORDER — HYDROMORPHONE HCL 1 MG/ML IJ SOLN
1.0000 mg | Freq: Once | INTRAMUSCULAR | Status: AC
  Administered 2015-11-09: 1 mg via INTRAVENOUS
  Filled 2015-11-09: qty 1

## 2015-11-09 MED ORDER — ATROPINE SULFATE 0.1 MG/ML IJ SOLN
1.0000 mg | Freq: Once | INTRAMUSCULAR | Status: AC
  Administered 2015-11-09: 1 mg via INTRAVENOUS
  Filled 2015-11-09: qty 10

## 2015-11-09 MED ORDER — ATROPINE SULFATE 0.1 MG/ML IJ SOLN
INTRAMUSCULAR | Status: AC
  Filled 2015-11-09: qty 10

## 2015-11-09 MED ORDER — ATROPINE SULFATE 0.1 MG/ML IJ SOLN
INTRAMUSCULAR | Status: AC
  Administered 2015-11-09: 1 mg
  Filled 2015-11-09: qty 20

## 2015-11-09 NOTE — ED Notes (Signed)
Log rolled patient. Skin care given to back.  Patient remains on long spine board per Dr. Mayford KnifeWilliams order.

## 2015-11-09 NOTE — ED Notes (Signed)
Return from CT scan.  This RN accompanied patient to scan.  Transport with cardiac monitor and defib.  Patient tolerated well.

## 2015-11-09 NOTE — ED Notes (Signed)
Fall this morning.  Patient states he fell to the floor.  On EMS arrival patient had no feeling or movement to legs or arms.  No sensation below nipples

## 2015-11-09 NOTE — ED Provider Notes (Signed)
[id number] [facility] Emergency Department Provider Note     Time seen:----------------------------------------- 12:54 PM on 11/09/2015 -----------------------------------------    I have reviewed the triage vital signs and the nursing notes.   HISTORY  Chief Complaint Fall    HPI James Clay is a 81 y.o. male brought the ER after fall this morning. Patient reportedly fell from standing, felt to the floor. On EMS arrival patient had no feeling or movement of his arms or legs. He had no sensation similar below the nipples. Patient is not sure why he fell. He is unable to move arms or legs on arrival.   Past Medical History  Diagnosis Date  . Hypertension     There are no active problems to display for this patient.   History reviewed. No pertinent past surgical history.  Allergies Review of patient's allergies indicates no known allergies.  Social History Social History  Substance Use Topics  . Smoking status: Never Smoker   . Smokeless tobacco: None  . Alcohol Use: Yes    Review of Systems Constitutional: Negative for fever. Eyes: Negative for visual changes. ENT: Negative for sore throat. Cardiovascular: Negative for chest pain. Respiratory: Negative for shortness of breath. Gastrointestinal: Patient is incontinent of stool Genitourinary: Patient is incontinent of urine Musculoskeletal: Positive for thoracic back pain, positive for neck pain Skin: Negative for rash. Neurological: Positive for numbness and inability to move his arms or legs.  10-point ROS otherwise negative.  ____________________________________________   PHYSICAL EXAM:  VITAL SIGNS: ED Triage Vitals  Enc Vitals Group     BP 11/09/15 1234 94/63 mmHg     Pulse Rate 11/09/15 1234 53     Resp 11/09/15 1234 13     Temp 11/09/15 1234 97.5 F (36.4 C)     Temp Source 11/09/15 1234 Oral     SpO2 11/09/15 1234 93 %     Weight 11/09/15 1234 220 lb (99.791 kg)      Height 11/09/15 1234 6\' 1"  (1.854 m)     Head Cir --      Peak Flow --      Pain Score 11/09/15 1235 0     Pain Loc --      Pain Edu? --      Excl. in GC? --     Constitutional: Alert and oriented. Patient is brought in C-spine immobilized. Eyes: Conjunctivae are normal. PERRL. Normal extraocular movements. ENT   Head: Normocephalic and atraumatic.   Nose: No congestion/rhinnorhea.   Mouth/Throat: Mucous membranes are moist.   Neck: No stridor. Cardiovascular: Slow rate, regular rhythm. Normal and symmetric distal pulses are present in all extremities. No murmurs, rubs, or gallops. Respiratory: Normal respiratory effort without tachypnea nor retractions. Patient with diminished respiratory effort, lungs are clear this time. Gastrointestinal: Soft and nontender. No distention. No abdominal bruits.  Musculoskeletal: Patient cannot move his arms or his legs, his extremities are asensate. No traumatic injuries are identified. Neurologic:  Normal speech and language. Patient does not had feeling below the nipples, she cannot move his arms or his legs at this time. Skin:  Skin is warm, dry and intact. No rash noted. Psychiatric: Mood and affect are normal. Speech and behavior are normal. Patient exhibits appropriate insight and judgment. ____________________________________________  EKG: Interpreted by me. Sinus bradycardia with rate of 30 bpm, PVCs, right bundle branch block, normal axis  ____________________________________________  ED COURSE:  Pertinent labs & imaging results that were available during my care of the patient were  reviewed by me and considered in my medical decision making (see chart for details). Patient presents hypotensive and bradycardic concerning for neurogenic shock. Patient may need central line and pressors. He is received atropine to improve his heart rate. Currently he is getting good spirits, likely catastrophic spinal cord  injury. ____________________________________________    LABS (pertinent positives/negatives)  Labs Reviewed  CBC WITH DIFFERENTIAL/PLATELET - Abnormal; Notable for the following:    WBC 2.6 (*)    RBC 4.39 (*)    HCT 39.1 (*)    Neutro Abs 1.3 (*)    All other components within normal limits  URINALYSIS COMPLETEWITH MICROSCOPIC ([facility] ONLY) - Abnormal; Notable for the following:    Color, Urine YELLOW (*)    APPearance CLEAR (*)    Protein, ur 30 (*)    Bacteria, UA RARE (*)    Squamous Epithelial / LPF 0-5 (*)    All other components within normal limits  COMPREHENSIVE METABOLIC PANEL  TROPONIN I  PROTIME-INR  APTT  CBG MONITORING, ED  TYPE AND SCREEN    RADIOLOGY Images were viewed by me  CT head, C-spine, chest abdomen pelvis  IMPRESSION: 1. Unstable injury of the cervical spine at C4-C5 with disruption of the disc space and both facet joints. Associated retrolisthesis and severe spinal stenosis. Suspect associated spinal cord injury at this level. No epidural hemorrhage identified, small volume prevertebral hemorrhage is present. 2. No acute fracture associated with the injury in #1. This type of cervical soft tissue injury is often seen in patients with spinal ankylosis, although this patient only appears fused at the left C7-T1 posterior elements. There are advanced cervical spine degenerative changes. 3. No acute intracranial abnormality. 4. Critical Value/emergent results were called by telephone at the time of interpretation on 11/09/2015 at 1341 hours. to Dr. Daryel November , who verbally acknowledged these results. IMPRESSION: 1. No traumatic finding in the chest or abdomen. 2. 10 cm infrarenal abdominal aortic aneurysm, continuing into both iliac arteries. Recommend vascular surgery consultation. 3. 14 mm chronic ground-glass nodule in the right upper lobe, likely low grade neoplasm. If clinically appropriate, this could be followed with 1 year  chest CT. ____________________________________________  FINAL ASSESSMENT AND PLAN  Fall, spinal cord injury, type II AV block, AAA  Plan: Patient with labs and imaging as dictated above. Patient with a multitude of health problems. Likely fall from second degree AV block and dropping every other beat. Subsequently he has C4-C5 injury spinal cord injury and subsequent paralysis. He also appears to have a AAA which will need to be addressed at a later date. He will be flown to [facility], currently he is remarkably symptomatic of a complaining of neck pain, he was given IV Dilaudid for pain, we did have pacer pads on him although he has not needed it. His oxygen saturations are good, his blood pressure is 166/61. Initially I suspected he had neurogenic shock however his blood pressure has rebounded nicely. He's been accepted in transfer by [facility] neurosurgery Dr. Valora Piccolo, MD   Emily Filbert, MD 11/09/15 [phone]

## 2015-11-09 NOTE — ED Notes (Signed)
Dentures and 2 yellow metal necklaces given to patient's granddaughter.  Patient's clothing and belongings sent home with granddaughter.

## 2015-12-06 DEATH — deceased
